# Patient Record
Sex: Female | Born: 1965 | Race: White | Hispanic: No | Smoking: Former smoker
Health system: Southern US, Community
[De-identification: ages and names within clinical notes are randomized; demographics above are authoritative.]

## PROBLEM LIST (undated history)

## (undated) DIAGNOSIS — K589 Irritable bowel syndrome without diarrhea: Secondary | ICD-10-CM

## (undated) DIAGNOSIS — T7840XA Allergy, unspecified, initial encounter: Secondary | ICD-10-CM

## (undated) DIAGNOSIS — F419 Anxiety disorder, unspecified: Secondary | ICD-10-CM

## (undated) HISTORY — DX: Irritable bowel syndrome, unspecified: K58.9

## (undated) HISTORY — DX: Allergy, unspecified, initial encounter: T78.40XA

## (undated) HISTORY — DX: Anxiety disorder, unspecified: F41.9

## (undated) HISTORY — PX: LAPAROSCOPY: SHX197

## (undated) HISTORY — PX: COLONOSCOPY: SHX174

---

## 1997-10-20 ENCOUNTER — Other Ambulatory Visit: Admission: RE | Admit: 1997-10-20 | Discharge: 1997-10-20 | Payer: Self-pay | Admitting: Obstetrics and Gynecology

## 1998-11-04 ENCOUNTER — Other Ambulatory Visit: Admission: RE | Admit: 1998-11-04 | Discharge: 1998-11-04 | Payer: Self-pay | Admitting: Obstetrics and Gynecology

## 1999-12-22 ENCOUNTER — Other Ambulatory Visit: Admission: RE | Admit: 1999-12-22 | Discharge: 1999-12-22 | Payer: Self-pay | Admitting: Obstetrics and Gynecology

## 2001-03-12 ENCOUNTER — Other Ambulatory Visit: Admission: RE | Admit: 2001-03-12 | Discharge: 2001-03-12 | Payer: Self-pay | Admitting: Obstetrics and Gynecology

## 2002-03-18 ENCOUNTER — Other Ambulatory Visit: Admission: RE | Admit: 2002-03-18 | Discharge: 2002-03-18 | Payer: Self-pay | Admitting: Obstetrics and Gynecology

## 2003-03-25 ENCOUNTER — Other Ambulatory Visit: Admission: RE | Admit: 2003-03-25 | Discharge: 2003-03-25 | Payer: Self-pay | Admitting: Obstetrics and Gynecology

## 2004-06-03 ENCOUNTER — Other Ambulatory Visit: Admission: RE | Admit: 2004-06-03 | Discharge: 2004-06-03 | Payer: Self-pay | Admitting: Obstetrics and Gynecology

## 2006-06-13 HISTORY — PX: SINOSCOPY: SHX187

## 2008-07-11 ENCOUNTER — Encounter: Admission: RE | Admit: 2008-07-11 | Discharge: 2008-07-11 | Payer: Self-pay | Admitting: Obstetrics and Gynecology

## 2009-07-13 ENCOUNTER — Encounter: Admission: RE | Admit: 2009-07-13 | Discharge: 2009-07-13 | Payer: Self-pay | Admitting: Obstetrics and Gynecology

## 2010-08-13 ENCOUNTER — Other Ambulatory Visit: Payer: Self-pay | Admitting: Obstetrics and Gynecology

## 2010-08-13 DIAGNOSIS — Z1231 Encounter for screening mammogram for malignant neoplasm of breast: Secondary | ICD-10-CM

## 2010-08-18 ENCOUNTER — Ambulatory Visit
Admission: RE | Admit: 2010-08-18 | Discharge: 2010-08-18 | Disposition: A | Discharge status: Home / Self Care | Payer: BC Managed Care – PPO | Source: Ambulatory Visit | Attending: Obstetrics and Gynecology | Admitting: Obstetrics and Gynecology

## 2010-08-18 DIAGNOSIS — Z1231 Encounter for screening mammogram for malignant neoplasm of breast: Secondary | ICD-10-CM

## 2011-08-12 ENCOUNTER — Other Ambulatory Visit: Payer: Self-pay | Admitting: Obstetrics and Gynecology

## 2011-08-12 DIAGNOSIS — Z1231 Encounter for screening mammogram for malignant neoplasm of breast: Secondary | ICD-10-CM

## 2011-08-19 ENCOUNTER — Ambulatory Visit
Admission: RE | Admit: 2011-08-19 | Discharge: 2011-08-19 | Disposition: A | Discharge status: Home / Self Care | Payer: BC Managed Care – PPO | Source: Ambulatory Visit | Attending: Obstetrics and Gynecology | Admitting: Obstetrics and Gynecology

## 2011-08-19 DIAGNOSIS — Z1231 Encounter for screening mammogram for malignant neoplasm of breast: Secondary | ICD-10-CM

## 2011-08-22 ENCOUNTER — Ambulatory Visit: Payer: BC Managed Care – PPO

## 2012-08-31 ENCOUNTER — Other Ambulatory Visit: Payer: Self-pay

## 2012-08-31 DIAGNOSIS — Z1231 Encounter for screening mammogram for malignant neoplasm of breast: Secondary | ICD-10-CM

## 2012-09-28 ENCOUNTER — Ambulatory Visit
Admission: RE | Admit: 2012-09-28 | Discharge: 2012-09-28 | Disposition: A | Discharge status: Home / Self Care | Payer: BC Managed Care – PPO | Source: Ambulatory Visit

## 2012-09-28 DIAGNOSIS — Z1231 Encounter for screening mammogram for malignant neoplasm of breast: Secondary | ICD-10-CM

## 2013-08-27 ENCOUNTER — Other Ambulatory Visit: Payer: Self-pay

## 2013-08-27 DIAGNOSIS — Z1231 Encounter for screening mammogram for malignant neoplasm of breast: Secondary | ICD-10-CM

## 2013-10-03 ENCOUNTER — Encounter (INDEPENDENT_AMBULATORY_CARE_PROVIDER_SITE_OTHER): Payer: Self-pay

## 2013-10-03 ENCOUNTER — Ambulatory Visit
Admission: RE | Admit: 2013-10-03 | Discharge: 2013-10-03 | Disposition: A | Discharge status: Home / Self Care | Payer: BC Managed Care – PPO | Source: Ambulatory Visit

## 2013-10-03 DIAGNOSIS — Z1231 Encounter for screening mammogram for malignant neoplasm of breast: Secondary | ICD-10-CM

## 2013-10-07 ENCOUNTER — Other Ambulatory Visit: Payer: Self-pay | Admitting: Obstetrics and Gynecology

## 2013-10-07 DIAGNOSIS — R928 Other abnormal and inconclusive findings on diagnostic imaging of breast: Secondary | ICD-10-CM

## 2013-10-17 ENCOUNTER — Ambulatory Visit
Admission: RE | Admit: 2013-10-17 | Discharge: 2013-10-17 | Disposition: A | Discharge status: Home / Self Care | Payer: BC Managed Care – PPO | Source: Ambulatory Visit | Attending: Obstetrics and Gynecology | Admitting: Obstetrics and Gynecology

## 2013-10-17 DIAGNOSIS — R928 Other abnormal and inconclusive findings on diagnostic imaging of breast: Secondary | ICD-10-CM

## 2015-02-17 ENCOUNTER — Other Ambulatory Visit: Payer: Self-pay | Admitting: Obstetrics and Gynecology

## 2015-02-17 DIAGNOSIS — R928 Other abnormal and inconclusive findings on diagnostic imaging of breast: Secondary | ICD-10-CM

## 2015-02-23 ENCOUNTER — Ambulatory Visit
Admission: RE | Admit: 2015-02-23 | Discharge: 2015-02-23 | Disposition: A | Discharge status: Home / Self Care | Payer: 59 | Source: Ambulatory Visit | Attending: Obstetrics and Gynecology | Admitting: Obstetrics and Gynecology

## 2015-02-23 DIAGNOSIS — R928 Other abnormal and inconclusive findings on diagnostic imaging of breast: Secondary | ICD-10-CM

## 2015-09-12 DIAGNOSIS — N951 Menopausal and female climacteric states: Secondary | ICD-10-CM | POA: Diagnosis not present

## 2015-09-12 DIAGNOSIS — E559 Vitamin D deficiency, unspecified: Secondary | ICD-10-CM | POA: Diagnosis not present

## 2015-09-12 DIAGNOSIS — E785 Hyperlipidemia, unspecified: Secondary | ICD-10-CM | POA: Diagnosis not present

## 2015-12-07 DIAGNOSIS — E785 Hyperlipidemia, unspecified: Secondary | ICD-10-CM | POA: Diagnosis not present

## 2015-12-07 DIAGNOSIS — N951 Menopausal and female climacteric states: Secondary | ICD-10-CM | POA: Diagnosis not present

## 2016-01-19 DIAGNOSIS — G501 Atypical facial pain: Secondary | ICD-10-CM | POA: Diagnosis not present

## 2016-01-19 DIAGNOSIS — R6884 Jaw pain: Secondary | ICD-10-CM | POA: Diagnosis not present

## 2016-02-25 DIAGNOSIS — R5383 Other fatigue: Secondary | ICD-10-CM | POA: Diagnosis not present

## 2016-02-25 DIAGNOSIS — N959 Unspecified menopausal and perimenopausal disorder: Secondary | ICD-10-CM | POA: Diagnosis not present

## 2016-02-25 DIAGNOSIS — K589 Irritable bowel syndrome without diarrhea: Secondary | ICD-10-CM | POA: Diagnosis not present

## 2016-02-25 DIAGNOSIS — F419 Anxiety disorder, unspecified: Secondary | ICD-10-CM | POA: Diagnosis not present

## 2016-03-08 DIAGNOSIS — Z6827 Body mass index (BMI) 27.0-27.9, adult: Secondary | ICD-10-CM | POA: Diagnosis not present

## 2016-03-08 DIAGNOSIS — Z1231 Encounter for screening mammogram for malignant neoplasm of breast: Secondary | ICD-10-CM | POA: Diagnosis not present

## 2016-03-08 DIAGNOSIS — Z01419 Encounter for gynecological examination (general) (routine) without abnormal findings: Secondary | ICD-10-CM | POA: Diagnosis not present

## 2016-03-12 DIAGNOSIS — N951 Menopausal and female climacteric states: Secondary | ICD-10-CM | POA: Diagnosis not present

## 2016-03-12 DIAGNOSIS — E785 Hyperlipidemia, unspecified: Secondary | ICD-10-CM | POA: Diagnosis not present

## 2016-04-04 DIAGNOSIS — R5383 Other fatigue: Secondary | ICD-10-CM | POA: Diagnosis not present

## 2016-04-04 DIAGNOSIS — K589 Irritable bowel syndrome without diarrhea: Secondary | ICD-10-CM | POA: Diagnosis not present

## 2016-04-04 DIAGNOSIS — E785 Hyperlipidemia, unspecified: Secondary | ICD-10-CM | POA: Diagnosis not present

## 2016-04-04 DIAGNOSIS — F419 Anxiety disorder, unspecified: Secondary | ICD-10-CM | POA: Diagnosis not present

## 2016-04-04 DIAGNOSIS — N959 Unspecified menopausal and perimenopausal disorder: Secondary | ICD-10-CM | POA: Diagnosis not present

## 2016-04-16 DIAGNOSIS — Z23 Encounter for immunization: Secondary | ICD-10-CM | POA: Diagnosis not present

## 2016-04-28 ENCOUNTER — Ambulatory Visit (INDEPENDENT_AMBULATORY_CARE_PROVIDER_SITE_OTHER): Payer: BLUE CROSS/BLUE SHIELD | Admitting: Allergy and Immunology

## 2016-04-28 ENCOUNTER — Encounter: Payer: Self-pay | Admitting: Allergy and Immunology

## 2016-04-28 VITALS — BP 110/68 | HR 68 | Temp 97.8°F | Resp 16 | Ht 67.0 in | Wt 180.0 lb

## 2016-04-28 DIAGNOSIS — R197 Diarrhea, unspecified: Secondary | ICD-10-CM

## 2016-04-28 DIAGNOSIS — Z91018 Allergy to other foods: Secondary | ICD-10-CM

## 2016-04-28 MED ORDER — COLESTIPOL HCL 1 G PO TABS: 1.0000 g | ORAL_TABLET | Freq: Two times a day (BID) | ORAL | 5 refills | Status: DC

## 2016-04-28 NOTE — Progress Notes (Signed)
NEW PATIENT NOTE  Referring Provider: No ref. provider found Primary Provider: Paulina FusiSCHULTZ,DOUGLAS E, MD Date of office visit: 04/28/2016    Subjective:   Chief Complaint:  Gabrielle Wilkerson (DOB: 08/30/1965) is a 50 y.o. female who presents to the clinic on 04/28/2016 with a chief complaint of Other (irritable bowel symptoms) .  HPI: Gabrielle Wilkerson presents to this clinic in evaluation of diarrhea. She has at least a 10 year history of diarrhea diagnosed as IBS. She does not really have a tremendous amount of constipation and she may have some occasional cramping but it is mostly just a diarrhea issue sometimes associated with tenesmus. She has this at least 3 times per week. She went on a recent diet modification where she is eating predominantly bird and vegetables and grains and this may have helped her somewhat but she still continues to have episodes at least 3 times a week. Very early in life she underwent a colonoscopy around the age of 50 for abdominal pain which apparently was nondiagnostic. She has sought out counsel with a integrative medicine practice and has had blood tests performed looking at IgG antibodies directed against food and has tried to modify her diet based upon these results which has not helped her. There is no obvious provoking factor giving rise to her diarrhea. She never notes any blood in her stool. She never notes any pus in her stool. She does drink 36 ounces of coffee every morning and has chocolate on almost a daily basis. She does not really have an atopic history other than some mild rhinitis and slight cough that may occur on some springtime seasons not requiring any therapy.  Past Medical History:  Diagnosis Date  . IBS (irritable bowel syndrome)     Past Surgical History:  Procedure Laterality Date  . LAPAROSCOPY  age 50  . SINOSCOPY  2008   Dr Shelva [facility]      Medication List      ALPRAZolam 0.25 MG tablet Commonly known as:  XANAX Take 0.25 mg by  mouth at bedtime as needed for anxiety.   aspirin EC 81 MG tablet Take 81 mg by mouth daily.   CALCIUM 500 + D3 PO Take 1,000 mg by mouth at bedtime.   Coenzyme Q10 100 MG Tabs Take 1 tablet by mouth daily.   Fish Oil 1000 MG Caps Take 200 mg by mouth daily.   MULTIVITAMIN ADULT PO Take by mouth.   PROBIOTIC-10 PO Take 1 tablet by mouth daily.   progesterone 100 MG capsule Commonly known as:  PROMETRIUM Take 100 mg by mouth daily.   Vitamin D-3 1000 units Caps Take 3,000 Units by mouth daily.       Allergies  Allergen Reactions  . Sulfa Antibiotics Rash    Review of systems negative except as noted in HPI / PMHx or noted below:  Review of Systems  Constitutional: Negative.   HENT: Negative.   Eyes: Negative.   Respiratory: Negative.   Cardiovascular: Negative.   Gastrointestinal: Negative.   Genitourinary: Negative.   Musculoskeletal: Negative.   Skin: Negative.   Neurological: Negative.   Endo/Heme/Allergies: Negative.   Psychiatric/Behavioral: Negative.     Family History  Problem Relation Age of Onset  . Alzheimer's disease Mother   . Heart Problems Father   . Hypertension Father   . Diabetes Father     Social History   Social History  . Marital status: Married    Spouse name: N/A  .  Number of children: N/A  . Years of education: N/A   Occupational History  . Not on file.   Social History Main Topics  . Smoking status: Former Smoker    Quit date: 06/14/2003  . Smokeless tobacco: Never Used  . Alcohol use Not on file  . Drug use: Unknown  . Sexual activity: Not on file   Other Topics Concern  . Not on file   Social History Narrative  . No narrative on file    Environmental and Social history  Lives in a house with a dry environment, cats and dogs located inside the household, carpeting in the bedroom, plastic on the bed and pillow, and no smoking ongoing with inside the household. She works in an office setting.  Objective:    Vitals:   04/28/16 0833  BP: 110/68  Pulse: 68  Resp: 16  Temp: 97.8 F (36.6 C)   Height: 5\' 7"  (170.2 cm) Weight: 180 lb (81.6 kg)  Physical Exam  Constitutional: She is well-developed, well-nourished, and in no distress.  HENT:  Head: Normocephalic. Head is without right periorbital erythema and without left periorbital erythema.  Right Ear: Tympanic membrane, external ear and ear canal normal.  Left Ear: Tympanic membrane, external ear and ear canal normal.  Nose: Nose normal. No mucosal edema or rhinorrhea.  Mouth/Throat: Oropharynx is clear and moist and mucous membranes are normal. No oropharyngeal exudate.  Eyes: Conjunctivae and lids are normal. Pupils are equal, round, and reactive to light.  Neck: Trachea normal. No tracheal deviation present. No thyromegaly present.  Cardiovascular: Normal rate, regular rhythm, S1 normal, S2 normal and normal heart sounds.   No murmur heard. Pulmonary/Chest: Effort normal. No stridor. No tachypnea. No respiratory distress. She has no wheezes. She has no rales. She exhibits no tenderness.  Abdominal: Soft. She exhibits no distension and no mass. There is no hepatosplenomegaly. There is no tenderness. There is no rebound and no guarding.  Musculoskeletal: She exhibits no edema or tenderness.  Lymphadenopathy:       Head (right side): No tonsillar adenopathy present.       Head (left side): No tonsillar adenopathy present.    She has no cervical adenopathy.    She has no axillary adenopathy.  Neurological: She is alert. Gait normal.  Skin: No rash noted. She is not diaphoretic. No erythema. No pallor. Nails show no clubbing.  Psychiatric: Mood and affect normal.    Diagnostics: Allergy skin tests were performed. She did not demonstrate any hypersensitivity against a screening panel of foods.  Assessment and Plan:    1. Food allergy   2. Diarrhea in adult patient     1. Allergen avoidance measures?  2. Test for bile salt  overabundance: Colestipol 1 g one tablet one-2 times a day  3. Blood - celiac screen with IgA  4. Further evaluation treatment? - Contact clinic next week with report  Lorene DyChristie will try colestipol to see if the issue with her diarrhea is tied up with bile salt-induced irritation of her intestine and I will obtain a blood test to look at possible celiac disease. I don't know if there is a very significant food allergy present giving rise to her issue but with his skin tests today there did not really appear to be a significant problem with hypersensitivity directed against specific foods. There could be some IgE mechanism giving rise to food hypersensitivity and this may need to be evaluated a little bit more thoroughly in the future  but for now we will going to hold off on any further evaluation for this issue and just see what happens by binding up her bile acids and of course ruling out celiac disease. There is also a possibility that she has developed lactose intolerance and we may need to work through that issue as well pending her response to therapy directed above.  Laurette Schimke, MD [facility]

## 2016-04-28 NOTE — Patient Instructions (Addendum)
  1. Allergen avoidance measures?  2. Test for bile salt overabundance: Colestipol 1 g one tablet one-2 times a day  3. Blood - celiac screen with IgA  4. Further evaluation treatment? - Contact clinic next week with report

## 2016-05-03 LAB — CELIAC PANEL 10
ANTIGLIADIN ABS, IGA: 4 U (ref 0–19)
ENDOMYSIAL IGA: NEGATIVE
Gliadin IgG: 1 units (ref 0–19)
IgA/Immunoglobulin A, Serum: 509 mg/dL — ABNORMAL HIGH (ref 87–352)

## 2016-05-03 LAB — RETICULIN ANTIBODIES, IGA W TITER: RETICULIN AB, IGA: NEGATIVE {titer}

## 2016-06-18 DIAGNOSIS — Z1389 Encounter for screening for other disorder: Secondary | ICD-10-CM | POA: Diagnosis not present

## 2016-06-18 DIAGNOSIS — Z0000 Encounter for general adult medical examination without abnormal findings: Secondary | ICD-10-CM | POA: Diagnosis not present

## 2016-06-18 DIAGNOSIS — E785 Hyperlipidemia, unspecified: Secondary | ICD-10-CM | POA: Diagnosis not present

## 2016-06-18 DIAGNOSIS — F419 Anxiety disorder, unspecified: Secondary | ICD-10-CM | POA: Diagnosis not present

## 2016-06-18 DIAGNOSIS — N959 Unspecified menopausal and perimenopausal disorder: Secondary | ICD-10-CM | POA: Diagnosis not present

## 2016-09-13 DIAGNOSIS — H40013 Open angle with borderline findings, low risk, bilateral: Secondary | ICD-10-CM | POA: Diagnosis not present

## 2016-09-17 DIAGNOSIS — E785 Hyperlipidemia, unspecified: Secondary | ICD-10-CM | POA: Diagnosis not present

## 2016-09-17 DIAGNOSIS — N951 Menopausal and female climacteric states: Secondary | ICD-10-CM | POA: Diagnosis not present

## 2016-11-22 DIAGNOSIS — H40013 Open angle with borderline findings, low risk, bilateral: Secondary | ICD-10-CM | POA: Diagnosis not present

## 2017-01-17 DIAGNOSIS — Z6829 Body mass index (BMI) 29.0-29.9, adult: Secondary | ICD-10-CM | POA: Diagnosis not present

## 2017-01-17 DIAGNOSIS — J019 Acute sinusitis, unspecified: Secondary | ICD-10-CM | POA: Diagnosis not present

## 2017-03-14 DIAGNOSIS — Z6829 Body mass index (BMI) 29.0-29.9, adult: Secondary | ICD-10-CM | POA: Diagnosis not present

## 2017-03-14 DIAGNOSIS — Z1231 Encounter for screening mammogram for malignant neoplasm of breast: Secondary | ICD-10-CM | POA: Diagnosis not present

## 2017-03-14 DIAGNOSIS — Z01419 Encounter for gynecological examination (general) (routine) without abnormal findings: Secondary | ICD-10-CM | POA: Diagnosis not present

## 2017-03-14 DIAGNOSIS — Z1151 Encounter for screening for human papillomavirus (HPV): Secondary | ICD-10-CM | POA: Diagnosis not present

## 2017-03-28 DIAGNOSIS — N882 Stricture and stenosis of cervix uteri: Secondary | ICD-10-CM | POA: Diagnosis not present

## 2017-03-28 DIAGNOSIS — N939 Abnormal uterine and vaginal bleeding, unspecified: Secondary | ICD-10-CM | POA: Diagnosis not present

## 2017-03-28 DIAGNOSIS — N950 Postmenopausal bleeding: Secondary | ICD-10-CM | POA: Diagnosis not present

## 2017-04-05 DIAGNOSIS — Z23 Encounter for immunization: Secondary | ICD-10-CM | POA: Diagnosis not present

## 2017-04-18 DIAGNOSIS — F411 Generalized anxiety disorder: Secondary | ICD-10-CM | POA: Diagnosis not present

## 2017-04-18 DIAGNOSIS — Z6829 Body mass index (BMI) 29.0-29.9, adult: Secondary | ICD-10-CM | POA: Diagnosis not present

## 2017-05-29 DIAGNOSIS — J343 Hypertrophy of nasal turbinates: Secondary | ICD-10-CM | POA: Diagnosis not present

## 2017-05-29 DIAGNOSIS — J342 Deviated nasal septum: Secondary | ICD-10-CM | POA: Diagnosis not present

## 2017-05-29 DIAGNOSIS — R0683 Snoring: Secondary | ICD-10-CM | POA: Diagnosis not present

## 2017-05-29 DIAGNOSIS — Z9889 Other specified postprocedural states: Secondary | ICD-10-CM | POA: Diagnosis not present

## 2017-07-22 DIAGNOSIS — Z0000 Encounter for general adult medical examination without abnormal findings: Secondary | ICD-10-CM | POA: Diagnosis not present

## 2017-07-28 DIAGNOSIS — Z1331 Encounter for screening for depression: Secondary | ICD-10-CM | POA: Diagnosis not present

## 2017-07-28 DIAGNOSIS — Z0000 Encounter for general adult medical examination without abnormal findings: Secondary | ICD-10-CM | POA: Diagnosis not present

## 2017-07-28 DIAGNOSIS — Z6828 Body mass index (BMI) 28.0-28.9, adult: Secondary | ICD-10-CM | POA: Diagnosis not present

## 2017-07-28 DIAGNOSIS — E785 Hyperlipidemia, unspecified: Secondary | ICD-10-CM | POA: Diagnosis not present

## 2017-10-30 DIAGNOSIS — Z6828 Body mass index (BMI) 28.0-28.9, adult: Secondary | ICD-10-CM | POA: Diagnosis not present

## 2017-10-30 DIAGNOSIS — J019 Acute sinusitis, unspecified: Secondary | ICD-10-CM | POA: Diagnosis not present

## 2018-03-12 DIAGNOSIS — M7042 Prepatellar bursitis, left knee: Secondary | ICD-10-CM | POA: Diagnosis not present

## 2018-03-23 DIAGNOSIS — Z6830 Body mass index (BMI) 30.0-30.9, adult: Secondary | ICD-10-CM | POA: Diagnosis not present

## 2018-03-23 DIAGNOSIS — Z1231 Encounter for screening mammogram for malignant neoplasm of breast: Secondary | ICD-10-CM | POA: Diagnosis not present

## 2018-03-23 DIAGNOSIS — Z01419 Encounter for gynecological examination (general) (routine) without abnormal findings: Secondary | ICD-10-CM | POA: Diagnosis not present

## 2018-04-12 DIAGNOSIS — J3089 Other allergic rhinitis: Secondary | ICD-10-CM | POA: Diagnosis not present

## 2018-04-13 DIAGNOSIS — Z23 Encounter for immunization: Secondary | ICD-10-CM | POA: Diagnosis not present

## 2018-06-29 DIAGNOSIS — F411 Generalized anxiety disorder: Secondary | ICD-10-CM | POA: Diagnosis not present

## 2018-06-29 DIAGNOSIS — Z1211 Encounter for screening for malignant neoplasm of colon: Secondary | ICD-10-CM | POA: Diagnosis not present

## 2018-07-16 DIAGNOSIS — R3129 Other microscopic hematuria: Secondary | ICD-10-CM | POA: Diagnosis not present

## 2018-07-16 DIAGNOSIS — F410 Panic disorder [episodic paroxysmal anxiety] without agoraphobia: Secondary | ICD-10-CM | POA: Diagnosis not present

## 2018-07-17 DIAGNOSIS — R05 Cough: Secondary | ICD-10-CM | POA: Diagnosis not present

## 2018-07-17 DIAGNOSIS — Z79899 Other long term (current) drug therapy: Secondary | ICD-10-CM | POA: Diagnosis not present

## 2018-07-17 DIAGNOSIS — R3129 Other microscopic hematuria: Secondary | ICD-10-CM | POA: Diagnosis not present

## 2018-07-17 DIAGNOSIS — Z87442 Personal history of urinary calculi: Secondary | ICD-10-CM | POA: Diagnosis not present

## 2018-07-17 DIAGNOSIS — J029 Acute pharyngitis, unspecified: Secondary | ICD-10-CM | POA: Diagnosis not present

## 2018-07-17 DIAGNOSIS — R509 Fever, unspecified: Secondary | ICD-10-CM | POA: Diagnosis not present

## 2018-07-23 DIAGNOSIS — F410 Panic disorder [episodic paroxysmal anxiety] without agoraphobia: Secondary | ICD-10-CM | POA: Diagnosis not present

## 2018-07-28 DIAGNOSIS — E785 Hyperlipidemia, unspecified: Secondary | ICD-10-CM | POA: Diagnosis not present

## 2018-07-28 DIAGNOSIS — R3129 Other microscopic hematuria: Secondary | ICD-10-CM | POA: Diagnosis not present

## 2018-07-28 DIAGNOSIS — F411 Generalized anxiety disorder: Secondary | ICD-10-CM | POA: Diagnosis not present

## 2018-07-28 DIAGNOSIS — Z0000 Encounter for general adult medical examination without abnormal findings: Secondary | ICD-10-CM | POA: Diagnosis not present

## 2018-07-30 DIAGNOSIS — R3129 Other microscopic hematuria: Secondary | ICD-10-CM | POA: Diagnosis not present

## 2018-07-30 DIAGNOSIS — I700 Atherosclerosis of aorta: Secondary | ICD-10-CM | POA: Diagnosis not present

## 2018-07-30 DIAGNOSIS — Z87442 Personal history of urinary calculi: Secondary | ICD-10-CM | POA: Diagnosis not present

## 2018-07-30 DIAGNOSIS — N281 Cyst of kidney, acquired: Secondary | ICD-10-CM | POA: Diagnosis not present

## 2018-07-31 ENCOUNTER — Encounter: Payer: Self-pay | Admitting: Gastroenterology

## 2018-07-31 DIAGNOSIS — F410 Panic disorder [episodic paroxysmal anxiety] without agoraphobia: Secondary | ICD-10-CM | POA: Diagnosis not present

## 2018-08-07 DIAGNOSIS — F410 Panic disorder [episodic paroxysmal anxiety] without agoraphobia: Secondary | ICD-10-CM | POA: Diagnosis not present

## 2018-08-13 DIAGNOSIS — F410 Panic disorder [episodic paroxysmal anxiety] without agoraphobia: Secondary | ICD-10-CM | POA: Diagnosis not present

## 2018-08-14 DIAGNOSIS — N281 Cyst of kidney, acquired: Secondary | ICD-10-CM | POA: Diagnosis not present

## 2018-08-14 DIAGNOSIS — R3129 Other microscopic hematuria: Secondary | ICD-10-CM | POA: Diagnosis not present

## 2018-08-24 ENCOUNTER — Ambulatory Visit (AMBULATORY_SURGERY_CENTER): Payer: Self-pay

## 2018-08-24 ENCOUNTER — Other Ambulatory Visit: Payer: Self-pay

## 2018-08-24 VITALS — Temp 97.9°F | Ht 67.0 in | Wt 186.6 lb

## 2018-08-24 DIAGNOSIS — Z1211 Encounter for screening for malignant neoplasm of colon: Secondary | ICD-10-CM

## 2018-08-24 DIAGNOSIS — Z8371 Family history of colonic polyps: Secondary | ICD-10-CM

## 2018-08-24 DIAGNOSIS — F410 Panic disorder [episodic paroxysmal anxiety] without agoraphobia: Secondary | ICD-10-CM | POA: Diagnosis not present

## 2018-08-24 NOTE — Progress Notes (Signed)
Per pt, no allergies to soy or egg products.Pt not taking any weight loss meds or using  O2 at home.  Pt refused emmi video.  Temp- 97.9. Per pt, has not traveled recently out of the country. No signs or symptoms of an illness.

## 2018-08-31 DIAGNOSIS — R0981 Nasal congestion: Secondary | ICD-10-CM | POA: Diagnosis not present

## 2018-08-31 DIAGNOSIS — J028 Acute pharyngitis due to other specified organisms: Secondary | ICD-10-CM | POA: Diagnosis not present

## 2018-09-04 DIAGNOSIS — F410 Panic disorder [episodic paroxysmal anxiety] without agoraphobia: Secondary | ICD-10-CM | POA: Diagnosis not present

## 2018-09-10 ENCOUNTER — Encounter: Payer: 59 | Admitting: Gastroenterology

## 2018-09-11 DIAGNOSIS — D2239 Melanocytic nevi of other parts of face: Secondary | ICD-10-CM | POA: Diagnosis not present

## 2018-09-11 DIAGNOSIS — L309 Dermatitis, unspecified: Secondary | ICD-10-CM | POA: Diagnosis not present

## 2018-09-11 DIAGNOSIS — L918 Other hypertrophic disorders of the skin: Secondary | ICD-10-CM | POA: Diagnosis not present

## 2018-09-11 DIAGNOSIS — D225 Melanocytic nevi of trunk: Secondary | ICD-10-CM | POA: Diagnosis not present

## 2018-09-20 DIAGNOSIS — F410 Panic disorder [episodic paroxysmal anxiety] without agoraphobia: Secondary | ICD-10-CM | POA: Diagnosis not present

## 2018-10-15 DIAGNOSIS — F410 Panic disorder [episodic paroxysmal anxiety] without agoraphobia: Secondary | ICD-10-CM | POA: Diagnosis not present

## 2018-10-23 DIAGNOSIS — F410 Panic disorder [episodic paroxysmal anxiety] without agoraphobia: Secondary | ICD-10-CM | POA: Diagnosis not present

## 2018-10-30 DIAGNOSIS — F410 Panic disorder [episodic paroxysmal anxiety] without agoraphobia: Secondary | ICD-10-CM | POA: Diagnosis not present

## 2018-11-01 ENCOUNTER — Encounter: Payer: Self-pay | Admitting: Gastroenterology

## 2018-11-08 DIAGNOSIS — M9902 Segmental and somatic dysfunction of thoracic region: Secondary | ICD-10-CM | POA: Diagnosis not present

## 2018-11-08 DIAGNOSIS — M62830 Muscle spasm of back: Secondary | ICD-10-CM | POA: Diagnosis not present

## 2018-11-08 DIAGNOSIS — M546 Pain in thoracic spine: Secondary | ICD-10-CM | POA: Diagnosis not present

## 2018-11-13 ENCOUNTER — Ambulatory Visit (AMBULATORY_SURGERY_CENTER): Payer: Self-pay | Admitting: *Deleted

## 2018-11-13 ENCOUNTER — Encounter: Payer: Self-pay | Admitting: Gastroenterology

## 2018-11-13 ENCOUNTER — Other Ambulatory Visit: Payer: Self-pay

## 2018-11-13 VITALS — Ht 67.5 in | Wt 185.0 lb

## 2018-11-13 DIAGNOSIS — Z1211 Encounter for screening for malignant neoplasm of colon: Secondary | ICD-10-CM

## 2018-11-13 MED ORDER — NA SULFATE-K SULFATE-MG SULF 17.5-3.13-1.6 GM/177ML PO SOLN: 1.0000 | Freq: Once | ORAL | 0 refills | Status: AC

## 2018-11-13 NOTE — Progress Notes (Signed)
No egg or soy allergy known to patient  No issues with past sedation with any surgeries  or procedures, no intubation problems  No diet pills per patient No home 02 use per patient  No blood thinners per patient  Pt denies issues with constipation  No A fib or A flutter  EMMI video sent to pt's e mail    Pt verified name, DOB, address and insurance during PV today. Pt mailed instruction packet to included paper to complete and mail back to [facility] with addressed and stamped envelope, Emmi video, copy of consent form to read and not return, and instructions. Suprep $15  coupon mailed in packet. PV completed over the phone. Pt encouraged to call with questions or issues   

## 2018-11-15 DIAGNOSIS — M9902 Segmental and somatic dysfunction of thoracic region: Secondary | ICD-10-CM | POA: Diagnosis not present

## 2018-11-15 DIAGNOSIS — M546 Pain in thoracic spine: Secondary | ICD-10-CM | POA: Diagnosis not present

## 2018-11-15 DIAGNOSIS — M62830 Muscle spasm of back: Secondary | ICD-10-CM | POA: Diagnosis not present

## 2018-11-22 DIAGNOSIS — M546 Pain in thoracic spine: Secondary | ICD-10-CM | POA: Diagnosis not present

## 2018-11-22 DIAGNOSIS — M9902 Segmental and somatic dysfunction of thoracic region: Secondary | ICD-10-CM | POA: Diagnosis not present

## 2018-11-22 DIAGNOSIS — M62830 Muscle spasm of back: Secondary | ICD-10-CM | POA: Diagnosis not present

## 2018-11-23 ENCOUNTER — Telehealth: Payer: Self-pay

## 2018-11-23 NOTE — Telephone Encounter (Signed)
Covid-19 screening questions  Have you traveled in the last 14 days? No. If yes where?  Do you now or have you had a fever in the last 14 days? No.  Do you have any respiratory symptoms of shortness of breath or cough now or in the last 14 days? No.  Do you have any family members or close contacts with diagnosed or suspected Covid-19 in the past 14 days? No.  Have you been tested for Covid-19 and found to be positive? No.       

## 2018-11-26 ENCOUNTER — Encounter: Payer: Self-pay | Admitting: Gastroenterology

## 2018-11-26 ENCOUNTER — Ambulatory Visit (AMBULATORY_SURGERY_CENTER): Payer: BLUE CROSS/BLUE SHIELD | Admitting: Gastroenterology

## 2018-11-26 ENCOUNTER — Other Ambulatory Visit: Payer: Self-pay

## 2018-11-26 VITALS — BP 122/74 | HR 68 | Temp 98.6°F | Resp 26 | Ht 67.0 in | Wt 180.0 lb

## 2018-11-26 DIAGNOSIS — Z8371 Family history of colonic polyps: Secondary | ICD-10-CM | POA: Diagnosis not present

## 2018-11-26 DIAGNOSIS — Z1211 Encounter for screening for malignant neoplasm of colon: Secondary | ICD-10-CM | POA: Diagnosis not present

## 2018-11-26 MED ORDER — SODIUM CHLORIDE 0.9 % IV SOLN: 500.0000 mL | Freq: Once | INTRAVENOUS | Status: DC

## 2018-11-26 NOTE — Patient Instructions (Signed)
Information on hemorrhoids given to you today.  YOU HAD AN ENDOSCOPIC PROCEDURE TODAY AT THE [facility]:   Refer to the procedure report that was given to you for any specific questions about what was found during the examination.  If the procedure report does not answer your questions, please call your gastroenterologist to clarify.  If you requested that your care partner not be given the details of your procedure findings, then the procedure report has been included in a sealed envelope for you to review at your convenience later.  YOU SHOULD EXPECT: Some feelings of bloating in the abdomen. Passage of more gas than usual.  Walking can help get rid of the air that was put into your GI tract during the procedure and reduce the bloating. If you had a lower endoscopy (such as a colonoscopy or flexible sigmoidoscopy) you may notice spotting of blood in your stool or on the toilet paper. If you underwent a bowel prep for your procedure, you may not have a normal bowel movement for a few days.  Please Note:  You might notice some irritation and congestion in your nose or some drainage.  This is from the oxygen used during your procedure.  There is no need for concern and it should clear up in a day or so.  SYMPTOMS TO REPORT IMMEDIATELY:   Following lower endoscopy (colonoscopy or flexible sigmoidoscopy):  Excessive amounts of blood in the stool  Significant tenderness or worsening of abdominal pains  Swelling of the abdomen that is new, acute  Fever of 100F or higher   For urgent or emergent issues, a gastroenterologist can be reached at any hour by calling [phone].   DIET:  We do recommend a small meal at first, but then you may proceed to your regular diet.  Drink plenty of fluids but you should avoid alcoholic beverages for 24 hours.  ACTIVITY:  You should plan to take it easy for the rest of today and you should NOT DRIVE or use heavy machinery until tomorrow (because  of the sedation medicines used during the test).    FOLLOW UP: Our staff will call the number listed on your records 48-72 hours following your procedure to check on you and address any questions or concerns that you may have regarding the information given to you following your procedure. If we do not reach you, we will leave a message.  We will attempt to reach you two times.  During this call, we will ask if you have developed any symptoms of COVID 19. If you develop any symptoms (ie: fever, flu-like symptoms, shortness of breath, cough etc.) before then, please call [phone].  If you test positive for Covid 19 in the 2 weeks post procedure, please call and report this information to us.    If any biopsies were taken you will be contacted by phone or by letter within the next 1-3 weeks.  Please call us at [phone] if you have not heard about the biopsies in 3 weeks.    SIGNATURES/CONFIDENTIALITY: You and/or your care partner have signed paperwork which will be entered into your electronic medical record.  These signatures attest to the fact that that the information above on your After Visit Summary has been reviewed and is understood.  Full responsibility of the confidentiality of this discharge information lies with you and/or your care-partner. 

## 2018-11-26 NOTE — Op Note (Signed)
[facility] Patient Name: Gabrielle Wilkerson Procedure Date: 11/26/2018 9:35 AM MRN: [id number] Endoscopist: Lynann Bolognaajesh Kamari Bilek , MD Age: [age] Referring MD:  Date of Birth: 03/13/1966 Gender: Female Account #: [id number] Procedure:                Colonoscopy Indications:              Colon cancer screening in patient at increased                            risk: Family history of 1st-degree relative (mom)                            with colon polyps before age 51 years Medicines:                Monitored Anesthesia Care Procedure:                Pre-Anesthesia Assessment:                           - Prior to the procedure, a History and Physical                            was performed, and patient medications and                            allergies were reviewed. The patient's tolerance of                            previous anesthesia was also reviewed. The risks                            and benefits of the procedure and the sedation                            options and risks were discussed with the patient.                            All questions were answered, and informed consent                            was obtained. Prior Anticoagulants: The patient has                            taken no previous anticoagulant or antiplatelet                            agents. ASA Grade Assessment: I - A normal, healthy                            patient. After reviewing the risks and benefits,                            the patient was deemed in satisfactory condition to  undergo the procedure.                           After obtaining informed consent, the colonoscope                            was passed under direct vision. Throughout the                            procedure, the patient's blood pressure, pulse, and                            oxygen saturations were monitored continuously. The                            Colonoscope was introduced through  the anus and                            advanced to the 2 cm into the ileum. The                            colonoscopy was performed without difficulty. The                            patient tolerated the procedure well. The quality                            of the bowel preparation was good. The terminal                            ileum, ileocecal valve, appendiceal orifice, and                            rectum were photographed. Scope In: 9:49:31 AM Scope Out: 10:04:42 AM Scope Withdrawal Time: 0 hours 11 minutes 37 seconds  Total Procedure Duration: 0 hours 15 minutes 11 seconds  Findings:                 Non-bleeding internal hemorrhoids were found during                            retroflexion. The hemorrhoids were small.                           The terminal ileum appeared normal.                           The exam was otherwise without abnormality on                            direct and retroflexion views. Complications:            No immediate complications. Estimated Blood Loss:     Estimated blood loss: none. Impression:               -Minimal internal hemorrhoids.                           -  Otherwise normal colonoscopy to TI. Recommendation:           - Patient has a contact number available for                            emergencies. The signs and symptoms of potential                            delayed complications were discussed with the                            patient. Return to normal activities tomorrow.                            Written discharge instructions were provided to the                            patient.                           - Resume previous diet.                           - Continue present medications.                           - Repeat colonoscopy in 5 years for screening                            purposes d/t strong family history of colonic                            polyps.                           - Return to GI clinic PRN. Lynann Bolognaajesh  Daziya Redmond, MD 11/26/2018 10:09:28 AM This report has been signed electronically.

## 2018-11-26 NOTE — Progress Notes (Signed)
Pt's states no medical or surgical changes since previsit or office visit.  Temps CW Vitals JB 

## 2018-11-26 NOTE — Progress Notes (Signed)
PT taken to PACU. Monitors in place. VSS. Report given to RN. 

## 2018-11-27 DIAGNOSIS — F410 Panic disorder [episodic paroxysmal anxiety] without agoraphobia: Secondary | ICD-10-CM | POA: Diagnosis not present

## 2018-11-28 ENCOUNTER — Telehealth: Payer: Self-pay

## 2018-11-28 NOTE — Telephone Encounter (Signed)
  Follow up Call-  Call back number 11/26/2018  Post procedure Call Back phone  # [phone]  Permission to leave phone message Yes  Some recent data might be hidden     Patient questions:  Do you have a fever, pain , or abdominal swelling? No. Pain Score  0 *  Have you tolerated food without any problems? Yes.    Have you been able to return to your normal activities? Yes.    Do you have any questions about your discharge instructions: Diet   No. Medications  No. Follow up visit  No.  Do you have questions or concerns about your Care? No.  Actions: * If pain score is 4 or above: No action needed, pain <4.  1. Have you developed a fever since your procedure? no  2.   Have you had an respiratory symptoms (SOB or cough) since your procedure? no  3.   Have you tested positive for COVID 19 since your procedure no  4.   Have you had any family members/close contacts diagnosed with the COVID 19 since your procedure?  no   If yes to any of these questions please route to Joylene John, RN and Alphonsa Gin, Therapist, sports.

## 2018-11-29 DIAGNOSIS — M546 Pain in thoracic spine: Secondary | ICD-10-CM | POA: Diagnosis not present

## 2018-11-29 DIAGNOSIS — M9902 Segmental and somatic dysfunction of thoracic region: Secondary | ICD-10-CM | POA: Diagnosis not present

## 2018-11-29 DIAGNOSIS — M62830 Muscle spasm of back: Secondary | ICD-10-CM | POA: Diagnosis not present

## 2018-12-13 DIAGNOSIS — M9902 Segmental and somatic dysfunction of thoracic region: Secondary | ICD-10-CM | POA: Diagnosis not present

## 2018-12-13 DIAGNOSIS — M62830 Muscle spasm of back: Secondary | ICD-10-CM | POA: Diagnosis not present

## 2018-12-13 DIAGNOSIS — M546 Pain in thoracic spine: Secondary | ICD-10-CM | POA: Diagnosis not present

## 2018-12-27 DIAGNOSIS — M9902 Segmental and somatic dysfunction of thoracic region: Secondary | ICD-10-CM | POA: Diagnosis not present

## 2018-12-27 DIAGNOSIS — M546 Pain in thoracic spine: Secondary | ICD-10-CM | POA: Diagnosis not present

## 2018-12-27 DIAGNOSIS — M62830 Muscle spasm of back: Secondary | ICD-10-CM | POA: Diagnosis not present

## 2019-01-10 DIAGNOSIS — M62830 Muscle spasm of back: Secondary | ICD-10-CM | POA: Diagnosis not present

## 2019-01-10 DIAGNOSIS — M9902 Segmental and somatic dysfunction of thoracic region: Secondary | ICD-10-CM | POA: Diagnosis not present

## 2019-01-10 DIAGNOSIS — M546 Pain in thoracic spine: Secondary | ICD-10-CM | POA: Diagnosis not present

## 2019-01-22 DIAGNOSIS — M546 Pain in thoracic spine: Secondary | ICD-10-CM | POA: Diagnosis not present

## 2019-01-22 DIAGNOSIS — M62830 Muscle spasm of back: Secondary | ICD-10-CM | POA: Diagnosis not present

## 2019-01-22 DIAGNOSIS — M9902 Segmental and somatic dysfunction of thoracic region: Secondary | ICD-10-CM | POA: Diagnosis not present

## 2019-02-19 DIAGNOSIS — M62830 Muscle spasm of back: Secondary | ICD-10-CM | POA: Diagnosis not present

## 2019-02-19 DIAGNOSIS — M9902 Segmental and somatic dysfunction of thoracic region: Secondary | ICD-10-CM | POA: Diagnosis not present

## 2019-02-19 DIAGNOSIS — M546 Pain in thoracic spine: Secondary | ICD-10-CM | POA: Diagnosis not present

## 2019-02-25 DIAGNOSIS — N281 Cyst of kidney, acquired: Secondary | ICD-10-CM | POA: Diagnosis not present

## 2019-02-25 DIAGNOSIS — R3129 Other microscopic hematuria: Secondary | ICD-10-CM | POA: Diagnosis not present

## 2019-03-19 DIAGNOSIS — M62830 Muscle spasm of back: Secondary | ICD-10-CM | POA: Diagnosis not present

## 2019-03-19 DIAGNOSIS — M546 Pain in thoracic spine: Secondary | ICD-10-CM | POA: Diagnosis not present

## 2019-03-19 DIAGNOSIS — M9902 Segmental and somatic dysfunction of thoracic region: Secondary | ICD-10-CM | POA: Diagnosis not present

## 2019-03-23 DIAGNOSIS — Z23 Encounter for immunization: Secondary | ICD-10-CM | POA: Diagnosis not present

## 2019-05-16 DIAGNOSIS — M62830 Muscle spasm of back: Secondary | ICD-10-CM | POA: Diagnosis not present

## 2019-05-16 DIAGNOSIS — M546 Pain in thoracic spine: Secondary | ICD-10-CM | POA: Diagnosis not present

## 2019-05-16 DIAGNOSIS — M9902 Segmental and somatic dysfunction of thoracic region: Secondary | ICD-10-CM | POA: Diagnosis not present

## 2019-05-20 DIAGNOSIS — R509 Fever, unspecified: Secondary | ICD-10-CM | POA: Diagnosis not present

## 2019-05-20 DIAGNOSIS — R519 Headache, unspecified: Secondary | ICD-10-CM | POA: Diagnosis not present

## 2019-05-20 DIAGNOSIS — Z20828 Contact with and (suspected) exposure to other viral communicable diseases: Secondary | ICD-10-CM | POA: Diagnosis not present

## 2019-05-20 DIAGNOSIS — J0110 Acute frontal sinusitis, unspecified: Secondary | ICD-10-CM | POA: Diagnosis not present

## 2019-05-20 DIAGNOSIS — M791 Myalgia, unspecified site: Secondary | ICD-10-CM | POA: Diagnosis not present

## 2019-06-10 DIAGNOSIS — Z6829 Body mass index (BMI) 29.0-29.9, adult: Secondary | ICD-10-CM | POA: Diagnosis not present

## 2019-06-10 DIAGNOSIS — Z1231 Encounter for screening mammogram for malignant neoplasm of breast: Secondary | ICD-10-CM | POA: Diagnosis not present

## 2019-06-10 DIAGNOSIS — Z01419 Encounter for gynecological examination (general) (routine) without abnormal findings: Secondary | ICD-10-CM | POA: Diagnosis not present

## 2019-08-03 DIAGNOSIS — E785 Hyperlipidemia, unspecified: Secondary | ICD-10-CM | POA: Diagnosis not present

## 2019-08-03 DIAGNOSIS — Z1331 Encounter for screening for depression: Secondary | ICD-10-CM | POA: Diagnosis not present

## 2019-08-03 DIAGNOSIS — F411 Generalized anxiety disorder: Secondary | ICD-10-CM | POA: Diagnosis not present

## 2019-08-03 DIAGNOSIS — Z0000 Encounter for general adult medical examination without abnormal findings: Secondary | ICD-10-CM | POA: Diagnosis not present

## 2019-08-12 DIAGNOSIS — L578 Other skin changes due to chronic exposure to nonionizing radiation: Secondary | ICD-10-CM | POA: Diagnosis not present

## 2019-08-12 DIAGNOSIS — D225 Melanocytic nevi of trunk: Secondary | ICD-10-CM | POA: Diagnosis not present

## 2019-08-12 DIAGNOSIS — L814 Other melanin hyperpigmentation: Secondary | ICD-10-CM | POA: Diagnosis not present

## 2019-08-12 DIAGNOSIS — L821 Other seborrheic keratosis: Secondary | ICD-10-CM | POA: Diagnosis not present

## 2019-08-15 DIAGNOSIS — M9905 Segmental and somatic dysfunction of pelvic region: Secondary | ICD-10-CM | POA: Diagnosis not present

## 2019-08-15 DIAGNOSIS — M9903 Segmental and somatic dysfunction of lumbar region: Secondary | ICD-10-CM | POA: Diagnosis not present

## 2019-08-15 DIAGNOSIS — M545 Low back pain: Secondary | ICD-10-CM | POA: Diagnosis not present

## 2019-08-15 DIAGNOSIS — M9902 Segmental and somatic dysfunction of thoracic region: Secondary | ICD-10-CM | POA: Diagnosis not present

## 2019-08-16 DIAGNOSIS — N281 Cyst of kidney, acquired: Secondary | ICD-10-CM | POA: Diagnosis not present

## 2019-08-16 DIAGNOSIS — R3129 Other microscopic hematuria: Secondary | ICD-10-CM | POA: Diagnosis not present

## 2019-08-16 DIAGNOSIS — N3289 Other specified disorders of bladder: Secondary | ICD-10-CM | POA: Diagnosis not present

## 2019-09-17 DIAGNOSIS — M545 Low back pain: Secondary | ICD-10-CM | POA: Diagnosis not present

## 2019-09-17 DIAGNOSIS — M9903 Segmental and somatic dysfunction of lumbar region: Secondary | ICD-10-CM | POA: Diagnosis not present

## 2019-09-17 DIAGNOSIS — M9902 Segmental and somatic dysfunction of thoracic region: Secondary | ICD-10-CM | POA: Diagnosis not present

## 2019-09-17 DIAGNOSIS — M9905 Segmental and somatic dysfunction of pelvic region: Secondary | ICD-10-CM | POA: Diagnosis not present

## 2019-10-24 DIAGNOSIS — M9905 Segmental and somatic dysfunction of pelvic region: Secondary | ICD-10-CM | POA: Diagnosis not present

## 2019-10-24 DIAGNOSIS — M545 Low back pain: Secondary | ICD-10-CM | POA: Diagnosis not present

## 2019-10-24 DIAGNOSIS — M9903 Segmental and somatic dysfunction of lumbar region: Secondary | ICD-10-CM | POA: Diagnosis not present

## 2019-10-24 DIAGNOSIS — M9902 Segmental and somatic dysfunction of thoracic region: Secondary | ICD-10-CM | POA: Diagnosis not present

## 2019-11-21 DIAGNOSIS — M9905 Segmental and somatic dysfunction of pelvic region: Secondary | ICD-10-CM | POA: Diagnosis not present

## 2019-11-21 DIAGNOSIS — M9903 Segmental and somatic dysfunction of lumbar region: Secondary | ICD-10-CM | POA: Diagnosis not present

## 2019-11-21 DIAGNOSIS — M9902 Segmental and somatic dysfunction of thoracic region: Secondary | ICD-10-CM | POA: Diagnosis not present

## 2019-11-21 DIAGNOSIS — M545 Low back pain: Secondary | ICD-10-CM | POA: Diagnosis not present

## 2019-12-19 DIAGNOSIS — M9903 Segmental and somatic dysfunction of lumbar region: Secondary | ICD-10-CM | POA: Diagnosis not present

## 2019-12-19 DIAGNOSIS — M545 Low back pain: Secondary | ICD-10-CM | POA: Diagnosis not present

## 2019-12-19 DIAGNOSIS — M9905 Segmental and somatic dysfunction of pelvic region: Secondary | ICD-10-CM | POA: Diagnosis not present

## 2019-12-19 DIAGNOSIS — M9902 Segmental and somatic dysfunction of thoracic region: Secondary | ICD-10-CM | POA: Diagnosis not present

## 2020-01-14 DIAGNOSIS — M7061 Trochanteric bursitis, right hip: Secondary | ICD-10-CM | POA: Diagnosis not present

## 2020-01-14 DIAGNOSIS — M25551 Pain in right hip: Secondary | ICD-10-CM | POA: Diagnosis not present

## 2020-01-14 HISTORY — DX: Trochanteric bursitis, right hip: M70.61

## 2020-02-25 DIAGNOSIS — M9902 Segmental and somatic dysfunction of thoracic region: Secondary | ICD-10-CM | POA: Diagnosis not present

## 2020-02-25 DIAGNOSIS — M9903 Segmental and somatic dysfunction of lumbar region: Secondary | ICD-10-CM | POA: Diagnosis not present

## 2020-02-25 DIAGNOSIS — M545 Low back pain: Secondary | ICD-10-CM | POA: Diagnosis not present

## 2020-02-25 DIAGNOSIS — M9905 Segmental and somatic dysfunction of pelvic region: Secondary | ICD-10-CM | POA: Diagnosis not present

## 2020-03-20 DIAGNOSIS — F411 Generalized anxiety disorder: Secondary | ICD-10-CM | POA: Diagnosis not present

## 2020-03-20 DIAGNOSIS — Z6829 Body mass index (BMI) 29.0-29.9, adult: Secondary | ICD-10-CM | POA: Diagnosis not present

## 2020-03-20 DIAGNOSIS — R030 Elevated blood-pressure reading, without diagnosis of hypertension: Secondary | ICD-10-CM | POA: Diagnosis not present

## 2020-04-03 DIAGNOSIS — R030 Elevated blood-pressure reading, without diagnosis of hypertension: Secondary | ICD-10-CM | POA: Diagnosis not present

## 2020-04-03 DIAGNOSIS — F411 Generalized anxiety disorder: Secondary | ICD-10-CM | POA: Diagnosis not present

## 2020-04-03 DIAGNOSIS — Z6828 Body mass index (BMI) 28.0-28.9, adult: Secondary | ICD-10-CM | POA: Diagnosis not present

## 2020-05-01 DIAGNOSIS — Z23 Encounter for immunization: Secondary | ICD-10-CM | POA: Diagnosis not present

## 2020-07-13 DIAGNOSIS — Z124 Encounter for screening for malignant neoplasm of cervix: Secondary | ICD-10-CM | POA: Diagnosis not present

## 2020-07-13 DIAGNOSIS — Z01411 Encounter for gynecological examination (general) (routine) with abnormal findings: Secondary | ICD-10-CM | POA: Diagnosis not present

## 2020-07-13 DIAGNOSIS — Z1231 Encounter for screening mammogram for malignant neoplasm of breast: Secondary | ICD-10-CM | POA: Diagnosis not present

## 2020-07-13 DIAGNOSIS — Z6829 Body mass index (BMI) 29.0-29.9, adult: Secondary | ICD-10-CM | POA: Diagnosis not present

## 2020-07-13 DIAGNOSIS — Z01419 Encounter for gynecological examination (general) (routine) without abnormal findings: Secondary | ICD-10-CM | POA: Diagnosis not present

## 2020-07-15 ENCOUNTER — Other Ambulatory Visit: Payer: Self-pay | Admitting: Obstetrics and Gynecology

## 2020-07-15 DIAGNOSIS — R3129 Other microscopic hematuria: Secondary | ICD-10-CM | POA: Diagnosis not present

## 2020-07-15 DIAGNOSIS — N951 Menopausal and female climacteric states: Secondary | ICD-10-CM

## 2020-07-15 DIAGNOSIS — N3289 Other specified disorders of bladder: Secondary | ICD-10-CM | POA: Diagnosis not present

## 2020-07-15 DIAGNOSIS — N281 Cyst of kidney, acquired: Secondary | ICD-10-CM | POA: Diagnosis not present

## 2020-07-30 DIAGNOSIS — Z6829 Body mass index (BMI) 29.0-29.9, adult: Secondary | ICD-10-CM | POA: Diagnosis not present

## 2020-07-30 DIAGNOSIS — Z0000 Encounter for general adult medical examination without abnormal findings: Secondary | ICD-10-CM | POA: Diagnosis not present

## 2020-08-17 DIAGNOSIS — L821 Other seborrheic keratosis: Secondary | ICD-10-CM | POA: Diagnosis not present

## 2020-08-17 DIAGNOSIS — D225 Melanocytic nevi of trunk: Secondary | ICD-10-CM | POA: Diagnosis not present

## 2020-08-17 DIAGNOSIS — L578 Other skin changes due to chronic exposure to nonionizing radiation: Secondary | ICD-10-CM | POA: Diagnosis not present

## 2020-08-17 DIAGNOSIS — L814 Other melanin hyperpigmentation: Secondary | ICD-10-CM | POA: Diagnosis not present

## 2020-11-26 ENCOUNTER — Other Ambulatory Visit: Payer: Self-pay

## 2020-11-26 ENCOUNTER — Ambulatory Visit
Admission: RE | Admit: 2020-11-26 | Discharge: 2020-11-26 | Disposition: A | Discharge status: Home / Self Care | Payer: BC Managed Care – PPO | Source: Ambulatory Visit | Attending: Obstetrics and Gynecology | Admitting: Obstetrics and Gynecology

## 2020-11-26 DIAGNOSIS — N951 Menopausal and female climacteric states: Secondary | ICD-10-CM

## 2020-11-26 DIAGNOSIS — Z780 Asymptomatic menopausal state: Secondary | ICD-10-CM | POA: Diagnosis not present

## 2020-11-26 DIAGNOSIS — M85851 Other specified disorders of bone density and structure, right thigh: Secondary | ICD-10-CM | POA: Diagnosis not present

## 2020-12-04 DIAGNOSIS — R438 Other disturbances of smell and taste: Secondary | ICD-10-CM | POA: Diagnosis not present

## 2020-12-04 DIAGNOSIS — R430 Anosmia: Secondary | ICD-10-CM | POA: Diagnosis not present

## 2020-12-04 DIAGNOSIS — J342 Deviated nasal septum: Secondary | ICD-10-CM | POA: Diagnosis not present

## 2020-12-04 DIAGNOSIS — U099 Post covid-19 condition, unspecified: Secondary | ICD-10-CM | POA: Diagnosis not present

## 2021-01-19 DIAGNOSIS — J3089 Other allergic rhinitis: Secondary | ICD-10-CM | POA: Diagnosis not present

## 2021-01-19 DIAGNOSIS — R0981 Nasal congestion: Secondary | ICD-10-CM | POA: Diagnosis not present

## 2021-01-19 DIAGNOSIS — E559 Vitamin D deficiency, unspecified: Secondary | ICD-10-CM | POA: Diagnosis not present

## 2021-01-19 DIAGNOSIS — I10 Essential (primary) hypertension: Secondary | ICD-10-CM | POA: Diagnosis not present

## 2021-03-12 DIAGNOSIS — R1012 Left upper quadrant pain: Secondary | ICD-10-CM | POA: Diagnosis not present

## 2021-03-12 DIAGNOSIS — Z6828 Body mass index (BMI) 28.0-28.9, adult: Secondary | ICD-10-CM | POA: Diagnosis not present

## 2021-03-12 DIAGNOSIS — I10 Essential (primary) hypertension: Secondary | ICD-10-CM | POA: Diagnosis not present

## 2021-04-09 DIAGNOSIS — Z23 Encounter for immunization: Secondary | ICD-10-CM | POA: Diagnosis not present

## 2021-05-03 DIAGNOSIS — J019 Acute sinusitis, unspecified: Secondary | ICD-10-CM | POA: Diagnosis not present

## 2021-06-08 DIAGNOSIS — J101 Influenza due to other identified influenza virus with other respiratory manifestations: Secondary | ICD-10-CM | POA: Diagnosis not present

## 2021-06-28 DIAGNOSIS — Z8709 Personal history of other diseases of the respiratory system: Secondary | ICD-10-CM | POA: Diagnosis not present

## 2021-06-28 DIAGNOSIS — Z87898 Personal history of other specified conditions: Secondary | ICD-10-CM | POA: Diagnosis not present

## 2021-06-28 DIAGNOSIS — R0683 Snoring: Secondary | ICD-10-CM | POA: Diagnosis not present

## 2021-06-28 DIAGNOSIS — Z8616 Personal history of COVID-19: Secondary | ICD-10-CM | POA: Diagnosis not present

## 2021-07-15 DIAGNOSIS — L720 Epidermal cyst: Secondary | ICD-10-CM | POA: Diagnosis not present

## 2021-07-15 DIAGNOSIS — I10 Essential (primary) hypertension: Secondary | ICD-10-CM | POA: Diagnosis not present

## 2021-07-15 DIAGNOSIS — R300 Dysuria: Secondary | ICD-10-CM | POA: Diagnosis not present

## 2021-07-23 DIAGNOSIS — E785 Hyperlipidemia, unspecified: Secondary | ICD-10-CM | POA: Diagnosis not present

## 2021-07-23 DIAGNOSIS — Z0000 Encounter for general adult medical examination without abnormal findings: Secondary | ICD-10-CM | POA: Diagnosis not present

## 2021-07-23 DIAGNOSIS — Z6829 Body mass index (BMI) 29.0-29.9, adult: Secondary | ICD-10-CM | POA: Diagnosis not present

## 2021-08-02 DIAGNOSIS — R918 Other nonspecific abnormal finding of lung field: Secondary | ICD-10-CM | POA: Diagnosis not present

## 2021-08-02 DIAGNOSIS — R911 Solitary pulmonary nodule: Secondary | ICD-10-CM | POA: Diagnosis not present

## 2021-08-02 DIAGNOSIS — I2510 Atherosclerotic heart disease of native coronary artery without angina pectoris: Secondary | ICD-10-CM | POA: Diagnosis not present

## 2021-08-24 DIAGNOSIS — Z1231 Encounter for screening mammogram for malignant neoplasm of breast: Secondary | ICD-10-CM | POA: Diagnosis not present

## 2021-08-24 DIAGNOSIS — Z6830 Body mass index (BMI) 30.0-30.9, adult: Secondary | ICD-10-CM | POA: Diagnosis not present

## 2021-08-24 DIAGNOSIS — Z124 Encounter for screening for malignant neoplasm of cervix: Secondary | ICD-10-CM | POA: Diagnosis not present

## 2021-08-24 DIAGNOSIS — Z01411 Encounter for gynecological examination (general) (routine) with abnormal findings: Secondary | ICD-10-CM | POA: Diagnosis not present

## 2021-08-24 DIAGNOSIS — Z01419 Encounter for gynecological examination (general) (routine) without abnormal findings: Secondary | ICD-10-CM | POA: Diagnosis not present

## 2021-09-02 DIAGNOSIS — N3289 Other specified disorders of bladder: Secondary | ICD-10-CM | POA: Diagnosis not present

## 2021-09-02 DIAGNOSIS — N281 Cyst of kidney, acquired: Secondary | ICD-10-CM | POA: Diagnosis not present

## 2021-09-02 DIAGNOSIS — R3129 Other microscopic hematuria: Secondary | ICD-10-CM | POA: Diagnosis not present

## 2021-09-15 DIAGNOSIS — L814 Other melanin hyperpigmentation: Secondary | ICD-10-CM | POA: Diagnosis not present

## 2021-09-15 DIAGNOSIS — L821 Other seborrheic keratosis: Secondary | ICD-10-CM | POA: Diagnosis not present

## 2021-09-15 DIAGNOSIS — L578 Other skin changes due to chronic exposure to nonionizing radiation: Secondary | ICD-10-CM | POA: Diagnosis not present

## 2021-09-15 DIAGNOSIS — D225 Melanocytic nevi of trunk: Secondary | ICD-10-CM | POA: Diagnosis not present

## 2021-10-21 DIAGNOSIS — E785 Hyperlipidemia, unspecified: Secondary | ICD-10-CM | POA: Diagnosis not present

## 2021-10-21 DIAGNOSIS — I10 Essential (primary) hypertension: Secondary | ICD-10-CM | POA: Diagnosis not present

## 2021-10-21 DIAGNOSIS — F411 Generalized anxiety disorder: Secondary | ICD-10-CM | POA: Diagnosis not present

## 2021-10-21 DIAGNOSIS — E559 Vitamin D deficiency, unspecified: Secondary | ICD-10-CM | POA: Diagnosis not present

## 2022-01-20 DIAGNOSIS — L301 Dyshidrosis [pompholyx]: Secondary | ICD-10-CM | POA: Diagnosis not present

## 2022-01-20 DIAGNOSIS — R911 Solitary pulmonary nodule: Secondary | ICD-10-CM | POA: Diagnosis not present

## 2022-01-20 DIAGNOSIS — Z1331 Encounter for screening for depression: Secondary | ICD-10-CM | POA: Diagnosis not present

## 2022-01-20 DIAGNOSIS — E785 Hyperlipidemia, unspecified: Secondary | ICD-10-CM | POA: Diagnosis not present

## 2022-01-20 DIAGNOSIS — I10 Essential (primary) hypertension: Secondary | ICD-10-CM | POA: Diagnosis not present

## 2022-03-02 DIAGNOSIS — S99912A Unspecified injury of left ankle, initial encounter: Secondary | ICD-10-CM | POA: Diagnosis not present

## 2022-04-22 DIAGNOSIS — I10 Essential (primary) hypertension: Secondary | ICD-10-CM | POA: Diagnosis not present

## 2022-04-22 DIAGNOSIS — E559 Vitamin D deficiency, unspecified: Secondary | ICD-10-CM | POA: Diagnosis not present

## 2022-04-22 DIAGNOSIS — E785 Hyperlipidemia, unspecified: Secondary | ICD-10-CM | POA: Diagnosis not present

## 2022-04-22 DIAGNOSIS — Z23 Encounter for immunization: Secondary | ICD-10-CM | POA: Diagnosis not present

## 2022-04-22 DIAGNOSIS — F411 Generalized anxiety disorder: Secondary | ICD-10-CM | POA: Diagnosis not present

## 2022-05-10 DIAGNOSIS — R21 Rash and other nonspecific skin eruption: Secondary | ICD-10-CM | POA: Diagnosis not present

## 2022-06-23 DIAGNOSIS — H02884 Meibomian gland dysfunction left upper eyelid: Secondary | ICD-10-CM | POA: Diagnosis not present

## 2022-07-06 DIAGNOSIS — J029 Acute pharyngitis, unspecified: Secondary | ICD-10-CM | POA: Diagnosis not present

## 2022-07-22 DIAGNOSIS — Z0000 Encounter for general adult medical examination without abnormal findings: Secondary | ICD-10-CM | POA: Diagnosis not present

## 2022-07-22 DIAGNOSIS — E785 Hyperlipidemia, unspecified: Secondary | ICD-10-CM | POA: Diagnosis not present

## 2022-07-27 DIAGNOSIS — R911 Solitary pulmonary nodule: Secondary | ICD-10-CM | POA: Diagnosis not present

## 2022-07-27 DIAGNOSIS — R918 Other nonspecific abnormal finding of lung field: Secondary | ICD-10-CM | POA: Diagnosis not present

## 2022-08-23 DIAGNOSIS — E042 Nontoxic multinodular goiter: Secondary | ICD-10-CM | POA: Diagnosis not present

## 2022-08-23 DIAGNOSIS — E041 Nontoxic single thyroid nodule: Secondary | ICD-10-CM | POA: Diagnosis not present

## 2022-08-30 DIAGNOSIS — Z124 Encounter for screening for malignant neoplasm of cervix: Secondary | ICD-10-CM | POA: Diagnosis not present

## 2022-08-30 DIAGNOSIS — Z01419 Encounter for gynecological examination (general) (routine) without abnormal findings: Secondary | ICD-10-CM | POA: Diagnosis not present

## 2022-08-30 DIAGNOSIS — Z1231 Encounter for screening mammogram for malignant neoplasm of breast: Secondary | ICD-10-CM | POA: Diagnosis not present

## 2022-09-02 ENCOUNTER — Other Ambulatory Visit: Payer: Self-pay | Admitting: Obstetrics and Gynecology

## 2022-09-02 DIAGNOSIS — E2839 Other primary ovarian failure: Secondary | ICD-10-CM

## 2022-09-06 ENCOUNTER — Other Ambulatory Visit: Payer: Self-pay

## 2022-09-06 DIAGNOSIS — T7840XA Allergy, unspecified, initial encounter: Secondary | ICD-10-CM | POA: Insufficient documentation

## 2022-09-06 DIAGNOSIS — K589 Irritable bowel syndrome without diarrhea: Secondary | ICD-10-CM | POA: Insufficient documentation

## 2022-09-06 DIAGNOSIS — F419 Anxiety disorder, unspecified: Secondary | ICD-10-CM | POA: Insufficient documentation

## 2022-09-12 NOTE — Progress Notes (Unsigned)
Cardiology Office Note:    Date:  09/13/2022   ID:  Gabrielle Wilkerson, DOB 1966/04/10, MRN [id number]  PCP:  Lowella Dandy, NP  Cardiologist:  Shirlee More, MD   Referring MD: Lowella Dandy, NP  ASSESSMENT:    1. Coronary artery calcification seen on CAT scan   2. Pulmonary nodules    PLAN:    In order of problems listed above:  I reviewed with her by itself atherosclerosis on imaging is not uncommon seen approximately 50% of people over age 55 even at high risk patients it does mean that she should take lipid-lowering therapy and she did not want permanent total statin qualitatively is described as mild or minor she is not having symptoms of chest pain ischemia angina and think she requires cardiac CTA. 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  Concerns regarding medicines are outlined above.  Orders Placed This Encounter  Procedures   Lipoprotein A (LPA)   EKG 12-Lead   No orders of the defined types were placed in this encounter.    Chief Complaint  Patient presents with   Coronary artery calcification on CT scan    History of Present Illness:    Gabrielle Wilkerson is a 57 y.o. female who is being seen today for the evaluation of coronary artery calcification on recent CT scan last month for pulmonary nodules at the request of [name], [name], NP. With descriptive term use by radiology this is a few areas of coronary calcification I would interpret that clinically as mild or minor. Recent labs 07/22/2022 creatinine 0.68 GFR 107 cc/min normal liver function test hemoglobin 13.8  platelets 270,000 lipid profile with cholesterol 176 LDL 102 triglycerides 93 HDL 57 non-HDL cholesterol 119  She has been on long-term lipid-lowering therapy for several years She has no known history of heart disease congenital rheumatic or atrial fibrillation Per father had congenital heart disease aortic valve replacement in his 40s and died in his [id number] as a complication of thromboembolism noncompliant with his anticoagulants She is seen a cardiologist in her late 34s for palpitation were monitored no abnormality was seen She has no episodes of chest pain edema shortness of breath or syncope She was unaware that her CT of the abdomen in 2020 showed aortic atherosclerosis She is a previous smoker having smoked 20 pack years stopping at age 70  I was able to obtain CT of her chest performed 08/02/2021 which also showed coronary artery calcifications described as occasional I would interpret this as minimal to mild.  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   atorvastatin (LIPITOR) 10 MG tablet Take 10 mg by mouth daily.   Calcium Carb-Cholecalciferol (CALCIUM 500 + D3 PO) Take 1,000 mg by mouth at bedtime.   Cholecalciferol (VITAMIN D-3) 1000 units CAPS Take 5,000 Units by mouth daily.    clobetasol cream (TEMOVATE) [id number] % Apply 1 Application topically 2 (two) times daily.   lisinopril (ZESTRIL) 2.5 MG tablet Take 2.5 mg by mouth daily.   NON FORMULARY Juice Plus-Take 8 pills daily     Allergies:   Sulfa antibiotics   Social  History   Socioeconomic History   Marital status: Married    Spouse name: Not on file   Number of children: Not on file   Years of education: Not on file   Highest education level: Not on file  Occupational History   Not on file  Tobacco Use   Smoking status: Former    Types: Cigarettes    Quit date: 06/14/2003    Years since quitting: 19.2   Smokeless tobacco: Never  Substance and Sexual Activity   Alcohol use: Not Currently   Drug use: Not Currently   Sexual activity: Yes  Other Topics Concern   Not on file  Social History Narrative   Not on file   Social Determinants of Health   Financial Resource Strain: Not on file  Food Insecurity: Not on file  Transportation Needs: Not on file  Physical Activity: Not on file  Stress: Not on file  Social Connections: Not on file     Family History: The patient's family history includes Alzheimer's disease in her mother; Angina in her father; Colon polyps in her mother; Diabetes in her father; Heart Problems in her father; Hypertension in her father. There is no history of Colon cancer, Esophageal cancer, Stomach cancer, or Rectal cancer.  ROS:   ROS Please see the history of present illness.     All other systems reviewed and are negative.  EKGs/Labs/Other Studies Reviewed:    The following studies were reviewed today:       EKG:  EKG is  ordered today.  The ekg ordered today is personally reviewed and demonstrates sinus rhythm with a normal   Physical Exam:    VS:  BP 116/72 (BP Location: Right Arm, Patient Position: Sitting)   Pulse 75   Ht 5\' 7"  (1.702 m)   Wt 183 lb 9.6 oz (83.3 kg)   LMP 07/29/2010   SpO2 98%   BMI 28.76 kg/m     Wt Readings from Last 3 Encounters:  09/13/22 183 lb 9.6 oz (83.3 kg)  11/26/18 180 lb (81.6 kg)  11/13/18 185 lb (83.9 kg)     GEN:  Well nourished, well developed in no acute distress HEENT: Normal NECK: No JVD; No carotid bruits LYMPHATICS: No lymphadenopathy CARDIAC: RRR,  no murmurs, rubs, gallops RESPIRATORY:  Clear to auscultation without rales, wheezing or rhonchi  ABDOMEN: Soft, non-tender, non-distended MUSCULOSKELETAL:  No edema; No deformity  SKIN: Warm and dry NEUROLOGIC:  Alert and oriented x 3 PSYCHIATRIC:  Normal affect     Signed, Shirlee More, MD  09/13/2022 9:40 AM    [place]

## 2022-09-13 ENCOUNTER — Ambulatory Visit: Payer: BC Managed Care – PPO | Attending: Cardiology | Admitting: Cardiology

## 2022-09-13 ENCOUNTER — Encounter: Payer: Self-pay | Admitting: Cardiology

## 2022-09-13 VITALS — BP 116/72 | HR 75 | Ht 67.0 in | Wt 183.6 lb

## 2022-09-13 DIAGNOSIS — I2510 Atherosclerotic heart disease of native coronary artery without angina pectoris: Secondary | ICD-10-CM

## 2022-09-13 DIAGNOSIS — R918 Other nonspecific abnormal finding of lung field: Secondary | ICD-10-CM | POA: Diagnosis not present

## 2022-09-13 NOTE — Patient Instructions (Signed)
Medication Instructions:  Your physician recommends that you continue on your current medications as directed. Please refer to the Current Medication list given to you today.  *If you need a refill on your cardiac medications before your next appointment, please call your pharmacy*   Lab Work: Your physician recommends that you return for lab work in:   Labs today: [facility]  If you have labs (blood work) drawn today and your tests are completely normal, you will receive your results only by: [place] (if you have [place]) OR A paper copy in the mail If you have any lab test that is abnormal or we need to change your treatment, we will call you to review the results.   Testing/Procedures: None   Follow-Up: At [facility], you and your health needs are our priority.  As part of our continuing mission to provide you with exceptional heart care, we have created designated Provider Care Teams.  These Care Teams include your primary Cardiologist (physician) and Advanced Practice Providers (APPs -  Physician Assistants and Nurse Practitioners) who all work together to provide you with the care you need, when you need it.  We recommend signing up for the patient portal called "MyChart".  Sign up information is provided on this After Visit Summary.  MyChart is used to connect with patients for Virtual Visits (Telemedicine).  Patients are able to view lab/test results, encounter notes, upcoming appointments, etc.  Non-urgent messages can be sent to your provider as well.   To learn more about what you can do with MyChart, go to [email or web address].    Your next appointment:   1 year(s)  Provider:   Shirlee More, MD    Other Instructions None

## 2022-09-15 LAB — LIPOPROTEIN A (LPA): Lipoprotein (a): 46.3 nmol/L (ref <75.0)

## 2022-09-28 DIAGNOSIS — L814 Other melanin hyperpigmentation: Secondary | ICD-10-CM | POA: Diagnosis not present

## 2022-09-28 DIAGNOSIS — D225 Melanocytic nevi of trunk: Secondary | ICD-10-CM | POA: Diagnosis not present

## 2022-09-28 DIAGNOSIS — D2239 Melanocytic nevi of other parts of face: Secondary | ICD-10-CM | POA: Diagnosis not present

## 2022-09-28 DIAGNOSIS — D485 Neoplasm of uncertain behavior of skin: Secondary | ICD-10-CM | POA: Diagnosis not present

## 2022-09-28 DIAGNOSIS — L821 Other seborrheic keratosis: Secondary | ICD-10-CM | POA: Diagnosis not present

## 2022-10-03 DIAGNOSIS — M545 Low back pain, unspecified: Secondary | ICD-10-CM | POA: Diagnosis not present

## 2022-10-13 DIAGNOSIS — M5459 Other low back pain: Secondary | ICD-10-CM | POA: Diagnosis not present

## 2022-10-13 DIAGNOSIS — M25551 Pain in right hip: Secondary | ICD-10-CM | POA: Diagnosis not present

## 2022-10-13 DIAGNOSIS — M461 Sacroiliitis, not elsewhere classified: Secondary | ICD-10-CM | POA: Diagnosis not present

## 2022-10-17 DIAGNOSIS — M461 Sacroiliitis, not elsewhere classified: Secondary | ICD-10-CM | POA: Diagnosis not present

## 2022-10-17 DIAGNOSIS — M25551 Pain in right hip: Secondary | ICD-10-CM | POA: Diagnosis not present

## 2022-10-17 DIAGNOSIS — M5459 Other low back pain: Secondary | ICD-10-CM | POA: Diagnosis not present

## 2022-10-24 DIAGNOSIS — M25551 Pain in right hip: Secondary | ICD-10-CM | POA: Diagnosis not present

## 2022-10-24 DIAGNOSIS — M461 Sacroiliitis, not elsewhere classified: Secondary | ICD-10-CM | POA: Diagnosis not present

## 2022-10-24 DIAGNOSIS — M5459 Other low back pain: Secondary | ICD-10-CM | POA: Diagnosis not present

## 2022-10-25 DIAGNOSIS — N3289 Other specified disorders of bladder: Secondary | ICD-10-CM | POA: Diagnosis not present

## 2022-10-25 DIAGNOSIS — N281 Cyst of kidney, acquired: Secondary | ICD-10-CM | POA: Diagnosis not present

## 2022-10-25 DIAGNOSIS — R3129 Other microscopic hematuria: Secondary | ICD-10-CM | POA: Diagnosis not present

## 2022-11-08 DIAGNOSIS — M25551 Pain in right hip: Secondary | ICD-10-CM | POA: Diagnosis not present

## 2022-11-08 DIAGNOSIS — M5459 Other low back pain: Secondary | ICD-10-CM | POA: Diagnosis not present

## 2022-11-08 DIAGNOSIS — M461 Sacroiliitis, not elsewhere classified: Secondary | ICD-10-CM | POA: Diagnosis not present

## 2022-12-07 DIAGNOSIS — H1045 Other chronic allergic conjunctivitis: Secondary | ICD-10-CM | POA: Diagnosis not present

## 2023-01-20 DIAGNOSIS — E785 Hyperlipidemia, unspecified: Secondary | ICD-10-CM | POA: Diagnosis not present

## 2023-01-20 DIAGNOSIS — R911 Solitary pulmonary nodule: Secondary | ICD-10-CM | POA: Diagnosis not present

## 2023-01-20 DIAGNOSIS — E559 Vitamin D deficiency, unspecified: Secondary | ICD-10-CM | POA: Diagnosis not present

## 2023-01-20 DIAGNOSIS — E041 Nontoxic single thyroid nodule: Secondary | ICD-10-CM | POA: Diagnosis not present

## 2023-01-20 DIAGNOSIS — I10 Essential (primary) hypertension: Secondary | ICD-10-CM | POA: Diagnosis not present

## 2023-03-08 ENCOUNTER — Ambulatory Visit
Admission: RE | Admit: 2023-03-08 | Discharge: 2023-03-08 | Disposition: A | Discharge status: Home / Self Care | Payer: BC Managed Care – PPO | Source: Ambulatory Visit | Attending: Obstetrics and Gynecology | Admitting: Obstetrics and Gynecology

## 2023-03-08 DIAGNOSIS — E2839 Other primary ovarian failure: Secondary | ICD-10-CM

## 2023-03-08 DIAGNOSIS — N958 Other specified menopausal and perimenopausal disorders: Secondary | ICD-10-CM | POA: Diagnosis not present

## 2023-03-08 DIAGNOSIS — E349 Endocrine disorder, unspecified: Secondary | ICD-10-CM | POA: Diagnosis not present

## 2023-04-07 DIAGNOSIS — Z23 Encounter for immunization: Secondary | ICD-10-CM | POA: Diagnosis not present

## 2023-05-15 ENCOUNTER — Telehealth: Payer: Self-pay | Admitting: Cardiology

## 2023-05-15 NOTE — Telephone Encounter (Signed)
Gabrielle Wilkerson is a pt of yours and has Lipo protein. Pt's daughter (18 yr) pediatrician wants to know if your recommend her being seen for the same. Please advise

## 2023-05-15 NOTE — Telephone Encounter (Signed)
Called Marcelino Duster at Dr. Claris Che office and she reported that they had performed an Lpa on the patient's daughter and they wanted to know if Dr. Dulce Sellar wanted to see her for the same reasons as her mother (Dr. Hulen Shouts patient). I had them fax the results to the office for Dr. Dulce Sellar to evaluate. Marcelino Duster had no further questions at this time.

## 2023-05-26 NOTE — Telephone Encounter (Signed)
Marcelino Duster from Dr. Claris Che Office is requesting to speak with RN Gerlene Burdock. Please advise.

## 2023-05-29 NOTE — Telephone Encounter (Signed)
Left message for the patient to call back.

## 2023-05-29 NOTE — Telephone Encounter (Signed)
Patient's daughter has an appointment to see Dr. Dulce Sellar on 08/21/23 at 8:40 am.

## 2023-05-29 NOTE — Telephone Encounter (Signed)
Called the patient and explained that her daughter could set-up an appointment with Dr. Dulce Sellar but it was not an emergency. I explained that I would have someone from the front office team call her to schedule her daughter's appointment.

## 2023-05-29 NOTE — Telephone Encounter (Signed)
Dr. Clent Ridges office calling back

## 2023-05-29 NOTE — Telephone Encounter (Signed)
Dr. Claris Che calling back for a response.

## 2023-07-28 DIAGNOSIS — Z6829 Body mass index (BMI) 29.0-29.9, adult: Secondary | ICD-10-CM | POA: Diagnosis not present

## 2023-07-28 DIAGNOSIS — E559 Vitamin D deficiency, unspecified: Secondary | ICD-10-CM | POA: Diagnosis not present

## 2023-07-28 DIAGNOSIS — E785 Hyperlipidemia, unspecified: Secondary | ICD-10-CM | POA: Diagnosis not present

## 2023-07-28 DIAGNOSIS — Z0000 Encounter for general adult medical examination without abnormal findings: Secondary | ICD-10-CM | POA: Diagnosis not present

## 2023-07-29 LAB — CMP 10231
A1c: 5.5
EGFR: 100

## 2023-07-31 ENCOUNTER — Other Ambulatory Visit (HOSPITAL_BASED_OUTPATIENT_CLINIC_OR_DEPARTMENT_OTHER): Payer: Self-pay | Admitting: Internal Medicine

## 2023-07-31 DIAGNOSIS — R911 Solitary pulmonary nodule: Secondary | ICD-10-CM

## 2023-07-31 DIAGNOSIS — E041 Nontoxic single thyroid nodule: Secondary | ICD-10-CM

## 2023-08-28 ENCOUNTER — Ambulatory Visit (INDEPENDENT_AMBULATORY_CARE_PROVIDER_SITE_OTHER)
Admission: RE | Admit: 2023-08-28 | Discharge: 2023-08-28 | Disposition: A | Discharge status: Home / Self Care | Payer: BC Managed Care – PPO | Source: Ambulatory Visit | Attending: Internal Medicine | Admitting: Internal Medicine

## 2023-08-28 ENCOUNTER — Ambulatory Visit (HOSPITAL_BASED_OUTPATIENT_CLINIC_OR_DEPARTMENT_OTHER)
Admission: RE | Admit: 2023-08-28 | Discharge: 2023-08-28 | Disposition: A | Discharge status: Home / Self Care | Payer: BC Managed Care – PPO | Source: Ambulatory Visit | Attending: Internal Medicine | Admitting: Internal Medicine

## 2023-08-28 DIAGNOSIS — E041 Nontoxic single thyroid nodule: Secondary | ICD-10-CM | POA: Diagnosis not present

## 2023-08-28 DIAGNOSIS — R918 Other nonspecific abnormal finding of lung field: Secondary | ICD-10-CM | POA: Diagnosis not present

## 2023-08-28 DIAGNOSIS — R911 Solitary pulmonary nodule: Secondary | ICD-10-CM | POA: Diagnosis not present

## 2023-08-28 DIAGNOSIS — I700 Atherosclerosis of aorta: Secondary | ICD-10-CM | POA: Diagnosis not present

## 2023-08-28 DIAGNOSIS — E042 Nontoxic multinodular goiter: Secondary | ICD-10-CM | POA: Diagnosis not present

## 2023-09-05 DIAGNOSIS — Z1331 Encounter for screening for depression: Secondary | ICD-10-CM | POA: Diagnosis not present

## 2023-09-05 DIAGNOSIS — Z1231 Encounter for screening mammogram for malignant neoplasm of breast: Secondary | ICD-10-CM | POA: Diagnosis not present

## 2023-09-05 DIAGNOSIS — R5383 Other fatigue: Secondary | ICD-10-CM | POA: Diagnosis not present

## 2023-09-05 DIAGNOSIS — Z124 Encounter for screening for malignant neoplasm of cervix: Secondary | ICD-10-CM | POA: Diagnosis not present

## 2023-09-05 DIAGNOSIS — Z01419 Encounter for gynecological examination (general) (routine) without abnormal findings: Secondary | ICD-10-CM | POA: Diagnosis not present

## 2023-09-06 LAB — COMPREHENSIVE METABOLIC PANEL WITH GFR: EGFR: 99

## 2023-09-12 ENCOUNTER — Encounter: Payer: Self-pay | Admitting: Gastroenterology

## 2023-09-15 ENCOUNTER — Ambulatory Visit: Payer: BC Managed Care – PPO | Admitting: Cardiology

## 2023-09-15 ENCOUNTER — Ambulatory Visit

## 2023-09-15 VITALS — BP 122/76 | HR 67 | Ht 67.0 in | Wt 190.4 lb

## 2023-09-15 DIAGNOSIS — I2510 Atherosclerotic heart disease of native coronary artery without angina pectoris: Secondary | ICD-10-CM | POA: Diagnosis not present

## 2023-09-15 DIAGNOSIS — I10 Essential (primary) hypertension: Secondary | ICD-10-CM | POA: Diagnosis not present

## 2023-09-15 NOTE — Patient Instructions (Signed)

## 2023-09-15 NOTE — Assessment & Plan Note (Addendum)
 Minimal coronary artery calcifications on prior CT chest from February 2023.   No mention of coronary calcifications on CT chest imaging report from 08-28-2023.  I reviewed these images myself and do not appreciate much coronary calcification.  Minimal aortic atherosclerosis observed. Reassured about the findings showing no major coronary calcification burden.  She has been on statin regimen Crestor 10 mg once daily for multiple years. Tolerating well. Recent lipid panel with LDL above 100 and February 2025.  She has excellent functional capacity, exercises regularly and made dietary modifications. Encouraged to continue with healthy dietary and lifestyle modifications. Given her lipoprotein a levels in the past and minimal coronary artery calcifications reported, reasonable to continue with low-dose Crestor as above as it has the added benefit of maturing plaque making it less vulnerable.  We discussed if at subsequent follow-up visit in 1 year if her LDL levels are excellent below 70 mg/dL and she would like to wean herself off statins or reduce the frequency, it would be reasonable.

## 2023-09-15 NOTE — Assessment & Plan Note (Signed)
 Well-controlled. Currently on a very small dose lisinopril 2.5 mg once daily. Target below 130/80 mmHg. She was hoping to wean herself off lisinopril. With her ongoing exercise, diet modifications and her recent weight loss over 10 pounds, if she is able to sustain this should be reasonable to begin her off lisinopril and monitor for blood pressures off medications.  Suggested she keep a blood pressure log for a week consistently. If the blood pressures are consistently below 120/80 mmHg she can stop the lisinopril and continue to monitor her blood pressures for another couple weeks.  If the blood pressures are consistently below 130/80 mmHg off lisinopril, she can continue to stay off antihypertensives. She understands if this is not the case she will resume the lisinopril for blood pressure optimization.

## 2023-09-15 NOTE — Progress Notes (Addendum)
 Cardiology Consultation:    Date:  09/15/2023   ID:  Gabrielle Wilkerson, DOB [birth year], MRN [id number]  PCP:  Gabrielle Party, NP  Cardiologist:  Gabrielle Murphy, MD   Referring MD: Gabrielle Party, NP   No chief complaint on file.    ASSESSMENT AND PLAN:   Gabrielle Wilkerson 58 year old woman with minimal coronary artery calcification noted on CT chest imaging from February 2023 at [facility] and evidence of aortic atherosclerosis on CT chest and abdominal imaging, dyslipidemia with elevated lipoprotein Wilkerson levels, hypertension, former smoker, was seen previously by Dr. Dulce Wilkerson and here for follow-up visit.  She has been taking statins even prior to visit with Dr. Dulce Wilkerson.  Problem List Items Addressed This Visit     Coronary artery calcification seen on CAT scan - Primary   Minimal coronary artery calcifications on prior CT chest from February 2023.   No mention of coronary calcifications on CT chest imaging report from 08-28-2023.  I reviewed these images myself and do not appreciate much coronary calcification.  Minimal aortic atherosclerosis observed. Reassured about the findings showing no major coronary calcification burden.  She has been on statin regimen Crestor 10 mg once daily for multiple years. Tolerating well. Recent lipid panel with LDL above 100 and February 2025.  She has excellent functional capacity, exercises regularly and made dietary modifications. Encouraged to continue with healthy dietary and lifestyle modifications. Given her lipoprotein Wilkerson levels in the past and minimal coronary artery calcifications reported, reasonable to continue with low-dose Crestor as above as it has the added benefit of maturing plaque making it less vulnerable.  We discussed if at subsequent follow-up visit in 1 year if her LDL levels are excellent below 70 mg/dL and she would like to wean herself off statins or reduce the frequency, it would be reasonable.        Relevant  Medications   rosuvastatin (CRESTOR) 10 MG tablet   Other Relevant Orders   EKG 12-Lead (Completed)   Hypertension   Well-controlled. Currently on Wilkerson very small dose lisinopril 2.5 mg once daily. Target below 130/80 mmHg. She was hoping to wean herself off lisinopril. With her ongoing exercise, diet modifications and her recent weight loss over 10 pounds, if she is able to sustain this should be reasonable to begin her off lisinopril and monitor for blood pressures off medications.  Suggested she keep Wilkerson blood pressure log for Wilkerson week consistently. If the blood pressures are consistently below 120/80 mmHg she can stop the lisinopril and continue to monitor her blood pressures for another couple weeks.  If the blood pressures are consistently below 130/80 mmHg off lisinopril, she can continue to stay off antihypertensives. She understands if this is not the case she will resume the lisinopril for blood pressure optimization.      Relevant Medications   rosuvastatin (CRESTOR) 10 MG tablet   Return to clinic tentatively in 1 year as she would like to discuss further lipid management based on the updated numbers.   History of Present Illness:    Gabrielle Wilkerson is Wilkerson 58 y.o. female who is being seen today for follow-up visit. Last visit at our office was 09-13-2022 with Dr. Dulce Wilkerson. PCP is [name], [name], NP.  Pleasant woman here for the visit by herself.  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  Coronary artery calcifications minimal reported on prior CT chest imaging from February 20[facility] at [facility], dyslipidemia, aortic atherosclerosis noted  on prior CT chest imaging and once again on more recent CT chest imaging from March 2025, hypertension.  Mentions overall she is doing well.  Since the start of the year she has been [facility] [facility] and modified her diet significantly and lost up to 10 pounds.  Doing her activities well without any  significant chest pain, shortness of breath. Taking her medications consistently. She was wondering if she would be able to wean herself off of lisinopril given her blood pressures being well-controlled.  Similarly she was wondering if she could wean down on the statins.  Quit smoking in 2005. Drinks alcohol rarely on social occasions. No substance abuse.  EKG in the clinic today shows sinus rhythm heart rate 67/min, normal PR interval, normal QRS duration 84 ms, QTc normal 433 ms.  For pulmonary nodules she has regular screening CT chest.  Most recent one was September 11, 2023 that showed aortic atherosclerosis.  Recent lipid panel from February 2025 she brought to the visit today at the office noted LDL levels 108.  Rest of her lab work BMP, CBC unremarkable.  Addendum 09/21/2023:  Reviewed these labs labs from 01/20/2023 at the office visit, document scanned into the chart. Lipoprotein Wilkerson 52.7 and apolipoprotein B 67 LDL-particle number 1015 LDL 84, HDL 64, total cholesterol 161, triglycerides 145  Labs from 09-05-2023 with BUN 14, creatinine 0.72, EGFR 99 Sodium 141, potassium 4.4 Normal transaminases and alkaline phosphatase  Past Medical History:  Diagnosis Date   Allergy    seasonal   Anxiety    IBS (irritable bowel syndrome)    Trochanteric bursitis of right hip 01/14/2020    Past Surgical History:  Procedure Laterality Date   COLONOSCOPY     was 58 years old   LAPAROSCOPY  age 59    of ovaries   SINOSCOPY  2008   Dr Shelva Majestic    Current Medications: Current Meds  Medication Sig   ALPRAZolam (XANAX) 0.25 MG tablet Take 0.25 mg by mouth at bedtime as needed for anxiety.   CALCIUM PO Take 1,200 mg by mouth daily.   Cholecalciferol (VITAMIN D-3) 1000 units CAPS Take 4,000 Units by mouth daily.   Coenzyme Q10 100 MG TABS Take 100 mg by mouth daily.   lisinopril (ZESTRIL) 2.5 MG tablet Take 2.5 mg by mouth daily.   NON FORMULARY Juice Plus-Take 8 pills daily   Omega-3  Fatty Acids (FISH OIL) 1000 MG CAPS Take 200 mg by mouth daily.   rosuvastatin (CRESTOR) 10 MG tablet Take 10 mg by mouth daily.     Allergies:   Sulfa antibiotics   Social History   Socioeconomic History   Marital status: Married    Spouse name: Not on file   Number of children: Not on file   Years of education: Not on file   Highest education level: Not on file  Occupational History   Not on file  Tobacco Use   Smoking status: Former    Current packs/day: 0.00    Types: Cigarettes    Quit date: 06/14/2003    Years since quitting: 20.2   Smokeless tobacco: Never  Substance and Sexual Activity   Alcohol use: Not Currently   Drug use: Not Currently   Sexual activity: Yes  Other Topics Concern   Not on file  Social History Narrative   Not on file   Social Drivers of Health   Financial Resource Strain: Not on file  Food  Insecurity: Not on file  Transportation Needs: Not on file  Physical Activity: Not on file  Stress: Not on file  Social Connections: Not on file     Family History: The patient's family history includes Alzheimer's disease in her mother; Angina in her father; Colon polyps in her mother; Diabetes in her father; Heart Problems in her father; Hypertension in her father. There is no history of Colon cancer, Esophageal cancer, Stomach cancer, or Rectal cancer. ROS:   Please see the history of present illness.    All 14 point review of systems negative except as described per history of present illness.  EKGs/Labs/Other Studies Reviewed:    The following studies were reviewed today:   EKG:  EKG Interpretation Date/Time:  Friday September 15 2023 16:34:07 EDT Ventricular Rate:  67 PR Interval:  184 QRS Duration:  84 QT Interval:  410 QTC Calculation: 433 R Axis:   77  Text Interpretation: Normal sinus rhythm Normal ECG No previous ECGs available Confirmed by Huntley Dec reddy [phone]) on 09/15/2023 4:52:59 PM    Recent Labs: No results found for  requested labs within last 365 days.  Recent Lipid Panel No results found for: "CHOL", "TRIG", "HDL", "CHOLHDL", "VLDL", "LDLCALC", "LDLDIRECT"  Physical Exam:    VS:  BP 122/76   Pulse 67   Ht 5\' 7"  (1.702 m)   Wt 190 lb 6.4 oz (86.4 kg)   LMP 07/29/2010   SpO2 98%   BMI 29.82 kg/m     Wt Readings from Last 3 Encounters:  09/15/23 190 lb 6.4 oz (86.4 kg)  09/13/22 183 lb 9.6 oz (83.3 kg)  11/26/18 180 lb (81.6 kg)     GENERAL:  Well nourished, well developed in no acute distress NECK: No JVD; No carotid bruits CARDIAC: RRR, S1 and S2 present, no murmurs, no rubs, no gallops CHEST:  Clear to auscultation without rales, wheezing or rhonchi  Extremities: No pitting pedal edema. Pulses bilaterally symmetric with radial 2+ and dorsalis pedis 2+ NEUROLOGIC:  Alert and oriented x 3  Medication Adjustments/Labs and Tests Ordered: Current medicines are reviewed at length with the patient today.  Concerns regarding medicines are outlined above.  Orders Placed This Encounter  Procedures   EKG 12-Lead   No orders of the defined types were placed in this encounter.   Signed, Cecille Amsterdam, MD, MPH, [facility]. 09/15/2023 5:28 PM    [facility]

## 2023-09-27 DIAGNOSIS — D2239 Melanocytic nevi of other parts of face: Secondary | ICD-10-CM | POA: Diagnosis not present

## 2023-09-27 DIAGNOSIS — L237 Allergic contact dermatitis due to plants, except food: Secondary | ICD-10-CM | POA: Diagnosis not present

## 2023-09-27 DIAGNOSIS — D225 Melanocytic nevi of trunk: Secondary | ICD-10-CM | POA: Diagnosis not present

## 2023-09-27 DIAGNOSIS — L814 Other melanin hyperpigmentation: Secondary | ICD-10-CM | POA: Diagnosis not present

## 2023-10-02 ENCOUNTER — Ambulatory Visit (HOSPITAL_BASED_OUTPATIENT_CLINIC_OR_DEPARTMENT_OTHER): Admission: EM | Admit: 2023-10-02 | Discharge: 2023-10-02 | Disposition: A | Discharge status: Home / Self Care

## 2023-10-02 ENCOUNTER — Encounter (HOSPITAL_BASED_OUTPATIENT_CLINIC_OR_DEPARTMENT_OTHER): Payer: Self-pay

## 2023-10-02 DIAGNOSIS — L237 Allergic contact dermatitis due to plants, except food: Secondary | ICD-10-CM | POA: Diagnosis not present

## 2023-10-02 NOTE — Discharge Instructions (Addendum)
 Use the triamcinolone cream that you have been prescribed.  Can continue Benadryl as needed.  Ice as needed.  Follow-up for any continued or worsening issues

## 2023-10-02 NOTE — ED Provider Notes (Signed)
 [facility]    CSN: [id number] Arrival date & time: 10/02/23  0919      History   Chief Complaint Chief Complaint  Patient presents with   Rash    HPI Gabrielle Wilkerson is a 58 y.o. female.   58 year old female presents today with rash.  The rash is located to the right arm, right shoulder, right sided abdomen and left lower extremity.  The rash started approximately 2 weeks ago.  Started after pulling out some brush in her backyard.  She has been doing Benadryl for itching but otherwise nothing to treat the rash.  Was prescribed some triamcinolone cream but never started.  Denies any pain with the rash, fever or chills   Rash   Past Medical History:  Diagnosis Date   Allergy    seasonal   Anxiety    IBS (irritable bowel syndrome)    Trochanteric bursitis of right hip 01/14/2020    Patient Active Problem List   Diagnosis Date Noted   Coronary artery calcification seen on CAT scan 09/15/2023   Hypertension 09/15/2023   IBS (irritable bowel syndrome) 09/06/2022   Anxiety 09/06/2022   Allergy 09/06/2022   Trochanteric bursitis of right hip 01/14/2020    Past Surgical History:  Procedure Laterality Date   COLONOSCOPY     was 58 years old   LAPAROSCOPY  age 40    of ovaries   SINOSCOPY  2008   Dr Stephenie Einstein    OB History   No obstetric history on file.      Home Medications    Prior to Admission medications   Medication Sig Start Date End Date Taking? Authorizing Provider  ALPRAZolam (XANAX) 0.25 MG tablet Take 0.25 mg by mouth at bedtime as needed for anxiety.    [provider]  CALCIUM PO Take 1,200 mg by mouth daily.    [provider]  Cholecalciferol (VITAMIN D-3) 1000 units CAPS Take 4,000 Units by mouth daily.    [provider]  Coenzyme Q10 100 MG TABS Take 100 mg by mouth daily. 01/14/20   [provider]  lisinopril (ZESTRIL) 2.5 MG tablet Take 2.5 mg by mouth daily. 08/22/22   [provider]  NON FORMULARY Juice Plus-Take 8 pills daily    [provider]  Omega-3 Fatty Acids (FISH OIL) 1000 MG CAPS Take 200 mg by mouth daily. 01/14/20   [provider]  rosuvastatin (CRESTOR) 10 MG tablet Take 10 mg by mouth daily. 06/19/23   [provider]    Family History Family History  Problem Relation Age of Onset   Alzheimer's disease Mother    Colon polyps Mother    Heart Problems Father    Hypertension Father    Diabetes Father    Angina Father    Colon cancer Neg Hx    Esophageal cancer Neg Hx    Stomach cancer Neg Hx    Rectal cancer Neg Hx     Social History Social History   Tobacco Use   Smoking status: Former    Current packs/day: 0.00    Types: Cigarettes    Quit date: 06/14/2003    Years since quitting: 20.3   Smokeless tobacco: Never  Substance Use Topics   Alcohol use: Not Currently   Drug use: Not Currently     Allergies   Sulfa antibiotics   Review of Systems Review of Systems  Skin:  Positive for rash.     Physical Exam Triage  Vital Signs ED Triage Vitals [10/02/23 0955]  Encounter Vitals Group     BP 138/78     Systolic BP Percentile      Diastolic BP Percentile      Pulse Rate 71     Resp 20     Temp 98.7 F (37.1 C)     Temp Source Oral     SpO2 98 %     Weight      Height      Head Circumference      Peak Flow      Pain Score 0     Pain Loc      Pain Education      Exclude from Growth Chart    No data found.  Updated Vital Signs BP 138/78 (BP Location: Left Arm)   Pulse 71   Temp 98.7 F (37.1 C) (Oral)   Resp 20   LMP 07/29/2010   SpO2 98%   Visual Acuity Right Eye Distance:   Left Eye Distance:   Bilateral Distance:    Right Eye Near:   Left Eye Near:    Bilateral Near:     Physical Exam Constitutional:      Appearance: Normal appearance.  Pulmonary:     Effort: Pulmonary effort is normal.  Skin:    Findings: Erythema and rash present.     Comments: Papular rash to right  forearm and upper arm. Similar rash to right shoulder/chest area, breast, abdomen Left lower extremity Generalized erythema to the right arm, no swelling.   Neurological:     Mental Status: She is alert.      UC Treatments / Results  Labs (all labs ordered are listed, but only abnormal results are displayed) Labs Reviewed - No data to display  EKG   Radiology No results found.  Procedures Procedures (including critical care time)  Medications Ordered in UC Medications - No data to display  Initial Impression / Assessment and Plan / UC Course  I have reviewed the triage vital signs and the nursing notes.  Pertinent labs & imaging results that were available during my care of the patient were reviewed by me and considered in my medical decision making (see chart for details).     Poison ivy dermatitis-recommended to start using the triamcinolone cream that she has been prescribed by her dermatologist.  She can continue Benadryl as needed. Follow-up as needed Final Clinical Impressions(s) / UC Diagnoses   Final diagnoses:  Poison ivy dermatitis     Discharge Instructions      Use the triamcinolone cream that you have been prescribed.  Can continue Benadryl as needed.  Ice as needed.  Follow-up for any continued or worsening issues    ED Prescriptions   None    PDMP not reviewed this encounter.   Landa Pine, FNP 10/02/23 1024

## 2023-10-02 NOTE — ED Triage Notes (Signed)
 Onset of rash to right arm on April 7th. Patient wasn't sure if it was poison ivy or shingles. Now having rash to right side of trunk, and right shoulder. States very itchy.

## 2023-10-07 ENCOUNTER — Encounter (HOSPITAL_BASED_OUTPATIENT_CLINIC_OR_DEPARTMENT_OTHER): Payer: Self-pay | Admitting: Emergency Medicine

## 2023-10-07 ENCOUNTER — Ambulatory Visit (HOSPITAL_BASED_OUTPATIENT_CLINIC_OR_DEPARTMENT_OTHER)
Admission: EM | Admit: 2023-10-07 | Discharge: 2023-10-07 | Disposition: A | Discharge status: Home / Self Care | Attending: Family Medicine | Admitting: Family Medicine

## 2023-10-07 DIAGNOSIS — R319 Hematuria, unspecified: Secondary | ICD-10-CM | POA: Diagnosis not present

## 2023-10-07 LAB — POCT URINALYSIS DIP (MANUAL ENTRY)
Bilirubin, UA: NEGATIVE
Glucose, UA: NEGATIVE mg/dL
Ketones, POC UA: NEGATIVE mg/dL
Nitrite, UA: POSITIVE — AB
Protein Ur, POC: 100 mg/dL — AB
Spec Grav, UA: 1.015
Urobilinogen, UA: 0.2 U/dL
pH, UA: 7

## 2023-10-07 MED ORDER — NITROFURANTOIN MONOHYD MACRO 100 MG PO CAPS: 100.0000 mg | ORAL_CAPSULE | Freq: Two times a day (BID) | ORAL | 0 refills | Status: DC

## 2023-10-07 NOTE — Discharge Instructions (Signed)
 Treating you for urinary tract infection.  Drink plenty of water.

## 2023-10-07 NOTE — ED Provider Notes (Addendum)
 [facility]    CSN: [id number] Arrival date & time: 10/07/23  0817      History   Chief Complaint Chief Complaint  Patient presents with   Hematuria    HPI Gabrielle Wilkerson is a 58 y.o. female.   Patient is a 58 year old female that presents today with hematuria and dysuria.  Symptoms started this morning.  Symptoms have been constant.  Recently treated for yeast infection.  She is not having any abdominal pain, back pain or fevers.   Hematuria    Past Medical History:  Diagnosis Date   Allergy    seasonal   Anxiety    IBS (irritable bowel syndrome)    Trochanteric bursitis of right hip 01/14/2020    Patient Active Problem List   Diagnosis Date Noted   Coronary artery calcification seen on CAT scan 09/15/2023   Hypertension 09/15/2023   IBS (irritable bowel syndrome) 09/06/2022   Anxiety 09/06/2022   Allergy 09/06/2022   Trochanteric bursitis of right hip 01/14/2020    Past Surgical History:  Procedure Laterality Date   COLONOSCOPY     was 58 years old   LAPAROSCOPY  age 47    of ovaries   SINOSCOPY  2008   Dr Stephenie Einstein    OB History   No obstetric history on file.      Home Medications    Prior to Admission medications   Medication Sig Start Date End Date Taking? Authorizing Provider  lisinopril (ZESTRIL) 2.5 MG tablet Take 2.5 mg by mouth daily. 08/22/22  Yes [provider]  nitrofurantoin, macrocrystal-monohydrate, (MACROBID) 100 MG capsule Take 1 capsule (100 mg total) by mouth 2 (two) times daily. 10/07/23  Yes Armella Stogner A, FNP  rosuvastatin (CRESTOR) 10 MG tablet Take 10 mg by mouth daily. 06/19/23  Yes [provider]  ALPRAZolam (XANAX) 0.25 MG tablet Take 0.25 mg by mouth at bedtime as needed for anxiety.    [provider]  CALCIUM PO Take 1,200 mg by mouth daily.    [provider]  Cholecalciferol (VITAMIN D-3) 1000 units CAPS Take 4,000 Units by mouth daily.    [provider]   Coenzyme Q10 100 MG TABS Take 100 mg by mouth daily. 01/14/20   [provider]  NON FORMULARY Juice Plus-Take 8 pills daily    [provider]  Omega-3 Fatty Acids (FISH OIL) 1000 MG CAPS Take 200 mg by mouth daily. 01/14/20   [provider]    Family History Family History  Problem Relation Age of Onset   Alzheimer's disease Mother    Colon polyps Mother    Heart Problems Father    Hypertension Father    Diabetes Father    Angina Father    Colon cancer Neg Hx    Esophageal cancer Neg Hx    Stomach cancer Neg Hx    Rectal cancer Neg Hx     Social History Social History   Tobacco Use   Smoking status: Former    Current packs/day: 0.00    Types: Cigarettes    Quit date: 06/14/2003    Years since quitting: 20.3   Smokeless tobacco: Never  Substance Use Topics   Alcohol use: Not Currently   Drug use: Not Currently     Allergies   Sulfa antibiotics   Review of Systems Review of Systems  Genitourinary:  Positive for hematuria.     Physical Exam Triage Vital Signs ED Triage Vitals  Encounter Vitals Group  BP 10/07/23 0855 (!) 149/79     Systolic BP Percentile --      Diastolic BP Percentile --      Pulse Rate 10/07/23 0855 81     Resp 10/07/23 0855 18     Temp 10/07/23 0855 97.9 F (36.6 C)     Temp Source 10/07/23 0855 Oral     SpO2 10/07/23 0855 98 %     Weight --      Height --      Head Circumference --      Peak Flow --      Pain Score 10/07/23 0854 0     Pain Loc --      Pain Education --      Exclude from Growth Chart --    No data found.  Updated Vital Signs BP (!) 149/79 (BP Location: Right Arm)   Pulse 81   Temp 97.9 F (36.6 C) (Oral)   Resp 18   LMP 07/29/2010   SpO2 98%   Visual Acuity Right Eye Distance:   Left Eye Distance:   Bilateral Distance:    Right Eye Near:   Left Eye Near:    Bilateral Near:     Physical Exam Vitals and nursing note reviewed.  Constitutional:      General: She is not  in acute distress.    Appearance: Normal appearance. She is not ill-appearing, toxic-appearing or diaphoretic.  Pulmonary:     Effort: Pulmonary effort is normal.  Neurological:     Mental Status: She is alert.  Psychiatric:        Mood and Affect: Mood normal.      UC Treatments / Results  Labs (all labs ordered are listed, but only abnormal results are displayed) Labs Reviewed  POCT URINALYSIS DIP (MANUAL ENTRY) - Abnormal; Notable for the following components:      Result Value   Color, UA orange (*)    Blood, UA large (*)    Protein Ur, POC =100 (*)    Nitrite, UA Positive (*)    Leukocytes, UA Small (1+) (*)    All other components within normal limits  URINE CULTURE    EKG   Radiology No results found.  Procedures Procedures (including critical care time)  Medications Ordered in UC Medications - No data to display  Initial Impression / Assessment and Plan / UC Course  I have reviewed the triage vital signs and the nursing notes.  Pertinent labs & imaging results that were available during my care of the patient were reviewed by me and considered in my medical decision making (see chart for details).     Hematuria-urine with small leuks, positive nitrites, large blood. She has not been taking Azo We will go ahead and treat for urinary tract infection at this time.  Treating with Macrobid.  Recommend push fluids Culture pending Follow-up for any continued issues Final Clinical Impressions(s) / UC Diagnoses   Final diagnoses:  Hematuria, unspecified type     Discharge Instructions      Treating you for urinary tract infection.  Drink plenty of water.    ED Prescriptions     Medication Sig Dispense Auth. Provider   nitrofurantoin, macrocrystal-monohydrate, (MACROBID) 100 MG capsule Take 1 capsule (100 mg total) by mouth 2 (two) times daily. 10 capsule Landa Pine, FNP      PDMP not reviewed this encounter.   Landa Pine, FNP 10/07/23  1219    Landa Pine,  FNP 10/07/23 1233

## 2023-10-07 NOTE — ED Triage Notes (Signed)
 Pt reports blood in her urine first noticed this morning, pt states it hurts when she pees. Pt did have a really bad yeast infection and was treated with medication.

## 2023-10-09 LAB — URINE CULTURE: Culture: >=100000 — AB

## 2023-10-12 DIAGNOSIS — F411 Generalized anxiety disorder: Secondary | ICD-10-CM | POA: Diagnosis not present

## 2023-10-12 DIAGNOSIS — Z6827 Body mass index (BMI) 27.0-27.9, adult: Secondary | ICD-10-CM | POA: Diagnosis not present

## 2023-10-12 DIAGNOSIS — R399 Unspecified symptoms and signs involving the genitourinary system: Secondary | ICD-10-CM | POA: Diagnosis not present

## 2023-11-09 DIAGNOSIS — N281 Cyst of kidney, acquired: Secondary | ICD-10-CM | POA: Diagnosis not present

## 2023-11-09 DIAGNOSIS — R3129 Other microscopic hematuria: Secondary | ICD-10-CM | POA: Diagnosis not present

## 2023-11-09 DIAGNOSIS — N3289 Other specified disorders of bladder: Secondary | ICD-10-CM | POA: Diagnosis not present

## 2023-11-13 ENCOUNTER — Ambulatory Visit (AMBULATORY_SURGERY_CENTER)

## 2023-11-13 ENCOUNTER — Encounter: Payer: Self-pay | Admitting: Gastroenterology

## 2023-11-13 VITALS — Ht 67.0 in | Wt 174.0 lb

## 2023-11-13 DIAGNOSIS — Z83719 Family history of colon polyps, unspecified: Secondary | ICD-10-CM

## 2023-11-13 MED ORDER — NA SULFATE-K SULFATE-MG SULF 17.5-3.13-1.6 GM/177ML PO SOLN: 1.0000 | Freq: Once | ORAL | 0 refills | Status: AC

## 2023-11-13 NOTE — Progress Notes (Signed)

## 2023-11-27 ENCOUNTER — Telehealth: Payer: Self-pay | Admitting: Gastroenterology

## 2023-11-27 NOTE — Telephone Encounter (Signed)
 PT is scheduled for a colonoscopy on 6/19 and was just diagnosed with a sinus infection. She is not on a regimen of antibiotics and wants to know can she proceed with procedure. Please advise.

## 2023-11-27 NOTE — Telephone Encounter (Signed)
 Pt stated that she was recently diagnosed with a sinus infection and taking antibiotics.   Pt states that she has no fever, no chest pain, no shortness of breath. Pt states that her only symptoms in her sinus cavity.   Pt was advised to proceed with the procedure as planned.

## 2023-11-30 ENCOUNTER — Encounter: Payer: Self-pay | Admitting: Gastroenterology

## 2023-11-30 ENCOUNTER — Ambulatory Visit: Admitting: Gastroenterology

## 2023-11-30 VITALS — BP 121/70 | HR 52 | Temp 97.8°F | Resp 14 | Ht 67.0 in | Wt 174.0 lb

## 2023-11-30 DIAGNOSIS — K5730 Diverticulosis of large intestine without perforation or abscess without bleeding: Secondary | ICD-10-CM

## 2023-11-30 DIAGNOSIS — Z1211 Encounter for screening for malignant neoplasm of colon: Secondary | ICD-10-CM

## 2023-11-30 DIAGNOSIS — D127 Benign neoplasm of rectosigmoid junction: Secondary | ICD-10-CM | POA: Diagnosis not present

## 2023-11-30 DIAGNOSIS — K640 First degree hemorrhoids: Secondary | ICD-10-CM

## 2023-11-30 DIAGNOSIS — Z83719 Family history of colon polyps, unspecified: Secondary | ICD-10-CM

## 2023-11-30 MED ORDER — SODIUM CHLORIDE 0.9 % IV SOLN: 500.0000 mL | Freq: Once | INTRAVENOUS | Status: DC

## 2023-11-30 NOTE — Progress Notes (Signed)
 Called to room to assist during endoscopic procedure.  Patient ID and intended procedure confirmed with present staff. Received instructions for my participation in the procedure from the performing physician.

## 2023-11-30 NOTE — Progress Notes (Signed)
 To pacu, VSS. Report to Rn.tb

## 2023-11-30 NOTE — Progress Notes (Signed)
 Pt's states no medical or surgical changes since previsit or office visit.

## 2023-11-30 NOTE — Patient Instructions (Signed)

## 2023-11-30 NOTE — Progress Notes (Signed)
 [facility] History and Physical   Primary Care Physician:  Olga Berthold, NP   Reason for Procedure:   FH polyps (mom)  Plan:    colon     HPI: Gabrielle Wilkerson is a 58 y.o. female    Past Medical History:  Diagnosis Date   Allergy    seasonal   Anxiety    IBS (irritable bowel syndrome)    Trochanteric bursitis of right hip 01/14/2020    Past Surgical History:  Procedure Laterality Date   COLONOSCOPY     was 58 years old   LAPAROSCOPY  age 81    of ovaries   SINOSCOPY  2008   Dr Stephenie Einstein    Prior to Admission medications   Medication Sig Start Date End Date Taking? Authorizing Provider  amoxicillin-clavulanate (AUGMENTIN) 875-125 MG tablet SMARTSIG:1 Tablet(s) By Mouth Every 12 Hours 11/27/23  Yes [provider]  Cholecalciferol (VITAMIN D-3) 1000 units CAPS Take 4,000 Units by mouth daily.   Yes [provider]  Coenzyme Q10 100 MG TABS Take 100 mg by mouth daily. 01/14/20  Yes [provider]  lisinopril (ZESTRIL) 2.5 MG tablet Take 2.5 mg by mouth daily. 08/22/22  Yes [provider]  NON FORMULARY Juice Plus-Take 10 pills daily   Yes [provider]  OVER THE COUNTER MEDICATION Take 1 capsule by mouth 3 (three) times daily. Slender X   Yes [provider]  rosuvastatin (CRESTOR) 10 MG tablet Take 10 mg by mouth daily. 06/19/23  Yes [provider]  ALPRAZolam (XANAX) 0.25 MG tablet Take 0.25 mg by mouth at bedtime as needed for anxiety.    [provider]  CALCIUM PO Take 1,200 mg by mouth daily.    [provider]    Current Outpatient Medications  Medication Sig Dispense Refill   amoxicillin-clavulanate (AUGMENTIN) 875-125 MG tablet SMARTSIG:1 Tablet(s) By Mouth Every 12 Hours     Cholecalciferol (VITAMIN D-3) 1000 units CAPS Take 4,000 Units by mouth daily.     Coenzyme Q10 100 MG TABS Take 100 mg by mouth daily.     lisinopril (ZESTRIL) 2.5 MG tablet Take 2.5 mg by  mouth daily.     NON FORMULARY Juice Plus-Take 10 pills daily     OVER THE COUNTER MEDICATION Take 1 capsule by mouth 3 (three) times daily. Slender X     rosuvastatin (CRESTOR) 10 MG tablet Take 10 mg by mouth daily.     ALPRAZolam (XANAX) 0.25 MG tablet Take 0.25 mg by mouth at bedtime as needed for anxiety.     CALCIUM PO Take 1,200 mg by mouth daily.     No current facility-administered medications for this visit.    Allergies as of 11/30/2023 - Review Complete 11/30/2023  Allergen Reaction Noted   Sulfa antibiotics Rash 04/28/2016    Family History  Problem Relation Age of Onset   Alzheimer's disease Mother    Colon polyps Mother    Heart Problems Father    Hypertension Father    Diabetes Father    Angina Father    Colon cancer Neg Hx    Esophageal cancer Neg Hx    Stomach cancer Neg Hx    Rectal cancer Neg Hx     Social History   Socioeconomic History   Marital status: Married    Spouse name: Not on file   Number of children: Not on file   Years of education: Not on file   Highest education level:  Not on file  Occupational History   Not on file  Tobacco Use   Smoking status: Former    Current packs/day: 0.00    Types: Cigarettes    Quit date: 06/14/2003    Years since quitting: 20.4   Smokeless tobacco: Never  Vaping Use   Vaping status: Never Used  Substance and Sexual Activity   Alcohol use: Not Currently   Drug use: Not Currently   Sexual activity: Yes  Other Topics Concern   Not on file  Social History Narrative   Not on file   Social Drivers of Health   Financial Resource Strain: Not on file  Food Insecurity: Not on file  Transportation Needs: Not on file  Physical Activity: Not on file  Stress: Not on file  Social Connections: Not on file  Intimate Partner Violence: Not on file    Review of Systems: Positive for none All other review of systems negative except as mentioned in the HPI.  Physical Exam: Vital signs in last 24  hours: @VSRANGES @   General:   Alert,  Well-developed, well-nourished, pleasant and cooperative in NAD Lungs:  Clear throughout to auscultation.   Heart:  Regular rate and rhythm; no murmurs, clicks, rubs,  or gallops. Abdomen:  Soft, nontender and nondistended. Normal bowel sounds.   Neuro/Psych:  Alert and cooperative. Normal mood and affect. A and O x 3    No significant changes were identified.  The patient continues to be an appropriate candidate for the planned procedure and anesthesia.   Magnus Schuller, MD. [facility] 11/30/2023 3:08 PM@

## 2023-11-30 NOTE — Op Note (Signed)
 [facility] Patient Name: Gabrielle Wilkerson Procedure Date: 11/30/2023 8:37 AM MRN: [id number] Endoscopist: Lajuan Pila , MD, [id number] Age: 58 Referring MD:  Date of Birth: 21-Jun-1965 Gender: Female Account #: [id number] Procedure:                Colonoscopy Indications:              Colon cancer screening in patient at increased                            risk: Family history of colon polyps (mom before                            age 6 years) Medicines:                Monitored Anesthesia Care Procedure:                Pre-Anesthesia Assessment:                           - Prior to the procedure, a History and Physical                            was performed, and patient medications and                            allergies were reviewed. The patient's tolerance of                            previous anesthesia was also reviewed. The risks                            and benefits of the procedure and the sedation                            options and risks were discussed with the patient.                            All questions were answered, and informed consent                            was obtained. Prior Anticoagulants: The patient has                            taken no anticoagulant or antiplatelet agents. ASA                            Grade Assessment: II - A patient with mild systemic                            disease. After reviewing the risks and benefits,                            the patient was deemed in satisfactory condition to  undergo the procedure.                           After obtaining informed consent, the colonoscope                            was passed under direct vision. Throughout the                            procedure, the patient's blood pressure, pulse, and                            oxygen saturations were monitored continuously. The                            Olympus Scope SN [id number] was introduced  through the                            anus and advanced to the 2 cm into the ileum. The                            colonoscopy was performed without difficulty. The                            patient tolerated the procedure well. The quality                            of the bowel preparation was good. The terminal                            ileum, ileocecal valve, appendiceal orifice, and                            rectum were photographed. Scope In: 8:51:01 AM Scope Out: 9:07:50 AM Scope Withdrawal Time: 0 hours 12 minutes 13 seconds  Total Procedure Duration: 0 hours 16 minutes 49 seconds  Findings:                 A 4 mm polyp was found in the recto-sigmoid colon.                            The polyp was sessile. The polyp was removed with a                            cold snare. Resection and retrieval were complete.                           A few small-mouthed diverticula were found in the                            sigmoid colon.                           Non-bleeding internal hemorrhoids were found during  retroflexion. The hemorrhoids were small and Grade                            I (internal hemorrhoids that do not prolapse).                           The terminal ileum appeared normal.                           The exam was otherwise without abnormality on                            direct and retroflexion views. Complications:            No immediate complications. Estimated Blood Loss:     Estimated blood loss: none. Impression:               - One 4 mm polyp at the recto-sigmoid colon,                            removed with a cold snare. Resected and retrieved.                           - Minimal sigmoid diverticulosis.                           - Non-bleeding internal hemorrhoids.                           - The examined portion of the ileum was normal.                           - The examination was otherwise normal on direct                             and retroflexion views. Recommendation:           - Patient has a contact number available for                            emergencies. The signs and symptoms of potential                            delayed complications were discussed with the                            patient. Return to normal activities tomorrow.                            Written discharge instructions were provided to the                            patient.                           - Resume previous diet.                           -  Continue present medications.                           - Await pathology results.                           - Repeat colonoscopy for surveillance based on                            pathology results.                           - The findings and recommendations were discussed                            with the patient's family. Lajuan Pila, MD 11/30/2023 9:12:10 AM This report has been signed electronically.

## 2023-12-01 ENCOUNTER — Telehealth: Payer: Self-pay | Admitting: *Deleted

## 2023-12-01 NOTE — Telephone Encounter (Signed)
  Follow up Call-     11/30/2023    7:43 AM  Call back number  Post procedure Call Back phone  # [phone]  Permission to leave phone message Yes     Patient questions:  Do you have a fever, pain , or abdominal swelling? No. Pain Score  0 *  Have you tolerated food without any problems? Yes.    Have you been able to return to your normal activities? Yes.    Do you have any questions about your discharge instructions: Diet   No. Medications  No. Follow up visit  No.  Do you have questions or concerns about your Care? No.  Actions: * If pain score is 4 or above: No action needed, pain <4.

## 2023-12-04 LAB — SURGICAL PATHOLOGY

## 2023-12-14 ENCOUNTER — Ambulatory Visit: Payer: Self-pay | Admitting: Gastroenterology

## 2023-12-22 ENCOUNTER — Telehealth: Payer: Self-pay | Admitting: Gastroenterology

## 2023-12-22 NOTE — Telephone Encounter (Signed)
 Patient called and stated that she was wanting to know if she would have wait 2 more year to have her colonoscopy and remove those pre cancerous polyps, could those polyps turned cancerous or is it a type of slow growing type. Patient is requesting a call back. Please advise.

## 2023-12-22 NOTE — Telephone Encounter (Signed)
 Returned patient call & she would like the timeline of her next colonoscopy moved up to at least 5 years instead of 7 d/t biopsy results and having a family history of polyps. Concerned if she waited another two years the polyp removed this time could have become cancerous. She is aware that doctor's have a set of guidelines that they follow & insurance is a factor as well, however she stated doctor's can over ride the guideline and is requesting review.

## 2023-12-23 NOTE — Telephone Encounter (Signed)
 Lets put recall colonoscopy for 5 years instead of 7 Please let her know RG

## 2023-12-26 NOTE — Telephone Encounter (Signed)
 Called and informed patient that recall will now be 5 years. Patient was advised to call us  in the interim if she has any concerns. Patient verbalized understanding and had no concerns at the end of the call.   Colonoscopy recall updated in epic.

## 2024-01-17 DIAGNOSIS — J309 Allergic rhinitis, unspecified: Secondary | ICD-10-CM | POA: Diagnosis not present

## 2024-01-17 DIAGNOSIS — J320 Chronic maxillary sinusitis: Secondary | ICD-10-CM | POA: Diagnosis not present

## 2024-01-17 DIAGNOSIS — J343 Hypertrophy of nasal turbinates: Secondary | ICD-10-CM | POA: Diagnosis not present

## 2024-01-29 DIAGNOSIS — I10 Essential (primary) hypertension: Secondary | ICD-10-CM | POA: Diagnosis not present

## 2024-01-29 DIAGNOSIS — E663 Overweight: Secondary | ICD-10-CM | POA: Diagnosis not present

## 2024-01-29 DIAGNOSIS — E559 Vitamin D deficiency, unspecified: Secondary | ICD-10-CM | POA: Diagnosis not present

## 2024-01-29 DIAGNOSIS — E785 Hyperlipidemia, unspecified: Secondary | ICD-10-CM | POA: Diagnosis not present

## 2024-01-31 DIAGNOSIS — L820 Inflamed seborrheic keratosis: Secondary | ICD-10-CM | POA: Diagnosis not present

## 2024-02-05 DIAGNOSIS — J3489 Other specified disorders of nose and nasal sinuses: Secondary | ICD-10-CM | POA: Diagnosis not present

## 2024-02-05 DIAGNOSIS — J324 Chronic pansinusitis: Secondary | ICD-10-CM | POA: Diagnosis not present

## 2024-02-05 DIAGNOSIS — J320 Chronic maxillary sinusitis: Secondary | ICD-10-CM | POA: Diagnosis not present

## 2024-02-05 DIAGNOSIS — J309 Allergic rhinitis, unspecified: Secondary | ICD-10-CM | POA: Diagnosis not present

## 2024-02-23 ENCOUNTER — Ambulatory Visit (INDEPENDENT_AMBULATORY_CARE_PROVIDER_SITE_OTHER): Admitting: Internal Medicine

## 2024-02-23 ENCOUNTER — Encounter: Payer: Self-pay | Admitting: Internal Medicine

## 2024-02-23 VITALS — BP 130/78 | HR 76 | Resp 18 | Ht 67.0 in | Wt 172.8 lb

## 2024-02-23 DIAGNOSIS — J343 Hypertrophy of nasal turbinates: Secondary | ICD-10-CM | POA: Diagnosis not present

## 2024-02-23 DIAGNOSIS — L500 Allergic urticaria: Secondary | ICD-10-CM | POA: Diagnosis not present

## 2024-02-23 DIAGNOSIS — L301 Dyshidrosis [pompholyx]: Secondary | ICD-10-CM

## 2024-02-23 DIAGNOSIS — J3089 Other allergic rhinitis: Secondary | ICD-10-CM | POA: Diagnosis not present

## 2024-02-23 DIAGNOSIS — T63481D Toxic effect of venom of other arthropod, accidental (unintentional), subsequent encounter: Secondary | ICD-10-CM

## 2024-02-23 DIAGNOSIS — T63481A Toxic effect of venom of other arthropod, accidental (unintentional), initial encounter: Secondary | ICD-10-CM

## 2024-02-23 NOTE — Progress Notes (Signed)
 NEW PATIENT Date of Service/Encounter:  02/23/24 Referring provider: Llewellyn Gerard LABOR, DO Primary care provider: Erick Greig LABOR, NP  Subjective:  Reiley Bertagnolli is a 58 y.o. female  presenting today for evaluation of chronic sinusitis History obtained from: chart review and patient.   Discussed the use of AI scribe software for clinical note transcription with the patient, who gave verbal consent to proceed.  History of Present Illness Novie Maggio Mckenzye Cutright is a 58 year old female who presents with chronic sinusitis and potential allergies prior to scheduled sinus surgery.  Chronic sinonasal symptoms - Persistent sinus symptoms since COVID-19 infection in 2020-2021, including headaches, congestion, sinus tenderness, and drainage - Sensation of swollen sinuses - Alternating nasal congestion when lying down - Fluctuating sense of smell - Frequent morning headaches that improve as the day progresses - Recurrent sinus infections despite two rounds of antibiotics, twice daily use of a mini pot, sinus spray, and Flonase - Symptoms persist despite current treatments  Cutaneous hypersensitivity reactions - Allergic reaction with hives approximately 1.5 years ago after eating a restaurant dish, resolved with Benadryl - Suspected shrimp as the trigger; has avoided shrimp since the reaction - No recurrence of hives or reactions to other foods, including mushrooms  Eczematous dermatitis - Eczema on hands for the past 1.5 to 2 years - Current symptoms improved compared to prior flares - Uses moisturizers such as Aquaphor and Eucerin - Trial of clobetasol without significant improvement - Avoids excessive hand washing and hand sanitizer  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 - Subsequent yellow jacket stings resulted in swelling but did not require hospitalization  Allergic rhinoconjunctivitis - History of seasonal allergy  symptoms, typically in spring and fall - Decrease in frequency of seasonal allergy episodes over the past few years - Uses Flonase for allergy symptoms but prefers to minimize medication use - Exposure to cats and a dog at home without significant allergic reactions      Other allergy screening: Asthma: no Rhino conjunctivitis: yes Food allergy: yes Medication allergy: yes Hymenoptera allergy: LLR with wasps  Urticaria: with unknown food exposure  Eczema:no History of recurrent infections suggestive of immunodeficency: no Vaccinations are up to date.   Past Medical History: Past Medical History:  Diagnosis Date   Allergy    seasonal   Anxiety    IBS (irritable bowel syndrome)    Trochanteric bursitis of right hip 01/14/2020   Medication List:  Current Outpatient Medications  Medication Sig Dispense Refill   ALPRAZolam (XANAX) 0.25 MG tablet Take 0.25 mg by mouth at bedtime as needed for anxiety.     CALCIUM PO Take 1,200 mg by mouth daily.     Cholecalciferol (VITAMIN D-3) 1000 units CAPS Take 4,000 Units by mouth daily.     Coenzyme Q10 100 MG TABS Take 100 mg by mouth daily.     amoxicillin-clavulanate (AUGMENTIN) 875-125 MG tablet SMARTSIG:1 Tablet(s) By Mouth Every 12 Hours     NON FORMULARY Juice Plus-Take 10 pills daily     OVER THE COUNTER MEDICATION Take 1 capsule by mouth 3 (three) times daily. Slender X     rosuvastatin (CRESTOR) 10 MG tablet Take 10 mg by mouth daily.     No current facility-administered medications for this visit.   Known Allergies:  Allergies  Allergen Reactions   Sulfa Antibiotics Rash   Past Surgical History: Past Surgical History:  Procedure Laterality Date   COLONOSCOPY  was 58 years old   LAPAROSCOPY  age 69    of ovaries   SINOSCOPY  2008   Dr Devora   Family History: Family History  Problem Relation Age of Onset   Alzheimer's disease Mother    Colon polyps Mother    Heart Problems Father    Hypertension Father     Diabetes Father    Angina Father    Colon cancer Neg Hx    Esophageal cancer Neg Hx    Stomach cancer Neg Hx    Rectal cancer Neg Hx    Social History: Aralyn lives single-family home built in [place].  No water damage in the house.  Wood in the family room carpet in the bedroom.  Dogs access to bedroom.  No roaches advised to be off floor.  No dust mite precaution.  Works in [employer].  Previous smoker from [phone] [id number].  Smokes 1 to 2 packs/day..   ROS:  All other systems negative except as noted per HPI.  Objective:  Blood pressure 130/78, pulse 76, resp. rate 18, height 5' 7 (1.702 m), weight 172 lb 12.8 oz (78.4 kg), last menstrual period 07/29/2010, SpO2 99%. Body mass index is 27.06 kg/m. Physical Exam:  General Appearance:  Alert, cooperative, no distress, appears stated age  Head:  Normocephalic, without obvious abnormality, atraumatic  Eyes:  Conjunctiva clear, EOM's intact  Ears EACs normal bilaterally and normal TMs bilaterally  Nose: Nares normal, edematous nasal mucosa, hypertrophic turbinates, no visible anterior polyps, and septum midline  Throat: Lips, tongue normal; teeth and gums normal, + cobblestoning  Neck: Supple, symmetrical  Lungs:   clear to auscultation bilaterally, Respirations unlabored, no coughing  Heart:  regular rate and rhythm and no murmur, Appears well perfused  Extremities: No edema  Skin: Skin color, texture, turgor normal and no rashes or lesions on visualized portions of skin  Neurologic: No gross deficits   Diagnostics: None done    Labs:  Lab Orders  No laboratory test(s) ordered today     Assessment and Plan  Assessment and Plan Assessment & Plan Chronic sinusitis and allergic rhinitis Chronic sinusitis with allergic rhinitis, likely worsened post-COVID. Symptoms suggest possible anatomical obstruction. Differential includes allergic inflammation versus anatomical issues. - Perform skin testing for  environmental allergens. - Proceed with planned sinus surgery. - Consider allergy shots post-surgery if symptoms persist. 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 No recurrence of hives. Shellfish more likely allergen than spices. Skin-only symptoms less concerning than multi-system reactions. Benadryl effective, indicating less severe reaction. - Perform skin testing for shellfish allergy. - Avoid shellfish until testing is complete.   Hand eczema Chronic hand eczema, likely dyshidrotic, exacerbated by wet work, possibly worsened post-COVID. Topical management emphasized due to potential side effects of systemic treatments. - Use clobetasol with Vaseline under gloves overnight. - Avoid getting hands wet and use moisturizers regularly. - Avoid hand sanitizers.   Wasp Large Local reactions  - Reaction to wasps is consistent with large local reactions  - this is a mild allergy and risk of serious allergic reactions very low  - For future reactions recommend: zyrtec 10mg  1-2 times daily until swelling resolves   Follow up: for allergy testing (1-55 and shellfish)   - avoid all antihistamines for 3 days prior     This note in its entirety was forwarded to the Provider who  requested this consultation.  Other:    Thank you for your kind referral. I appreciate the opportunity to take part in Laketa's care. Please do not hesitate to contact me with questions.  Sincerely,  Thank you so much for letting me partake in your care today.  Don't hesitate to reach out if you have any additional concerns!  Hargis Springer, MD  [facility] [place] [place]

## 2024-02-23 NOTE — Patient Instructions (Signed)
 Chronic sinusitis and allergic rhinitis Chronic sinusitis with allergic rhinitis, likely worsened post-COVID. Symptoms suggest possible anatomical obstruction. Differential includes allergic inflammation versus anatomical issues. - Perform skin testing for environmental allergens. - Proceed with planned sinus surgery. - Consider allergy shots post-surgery if symptoms persist. 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 No recurrence of hives. Shellfish more likely allergen than spices. Skin-only symptoms less concerning than multi-system reactions. Benadryl effective, indicating less severe reaction. - Perform skin testing for shellfish allergy. - Avoid shellfish until testing is complete.   Hand eczema Chronic hand eczema, likely dyshidrotic, exacerbated by wet work, possibly worsened post-COVID. Topical management emphasized due to potential side effects of systemic treatments. - Use clobetasol with Vaseline under gloves overnight. - Avoid getting hands wet and use moisturizers regularly. - Avoid hand sanitizers.   Wasp Large Local reactions  - Reaction to wasps is consistent with large local reactions  - this is a mild allergy and risk of serious allergic reactions very low  - For future reactions recommend: zyrtec 10mg  1-2 times daily until swelling resolves   Follow up: for allergy testing (1-55 and shellfish)   - avoid all antihistamines for 3 days prior

## 2024-03-05 ENCOUNTER — Other Ambulatory Visit: Payer: Self-pay | Admitting: Otolaryngology

## 2024-03-05 DIAGNOSIS — J324 Chronic pansinusitis: Secondary | ICD-10-CM | POA: Diagnosis not present

## 2024-03-05 DIAGNOSIS — J3489 Other specified disorders of nose and nasal sinuses: Secondary | ICD-10-CM | POA: Diagnosis not present

## 2024-03-05 DIAGNOSIS — J338 Other polyp of sinus: Secondary | ICD-10-CM | POA: Diagnosis not present

## 2024-03-06 LAB — SURGICAL PATHOLOGY

## 2024-03-13 DIAGNOSIS — J309 Allergic rhinitis, unspecified: Secondary | ICD-10-CM | POA: Diagnosis not present

## 2024-03-13 DIAGNOSIS — J324 Chronic pansinusitis: Secondary | ICD-10-CM | POA: Diagnosis not present

## 2024-03-20 DIAGNOSIS — J324 Chronic pansinusitis: Secondary | ICD-10-CM | POA: Diagnosis not present

## 2024-03-25 NOTE — Patient Instructions (Incomplete)
 Chronic sinusitis with nasal polyp Past history: Chronic sinusitis , likely worsened post-COVID. Symptoms suggest possible anatomical obstruction. Differential includes allergic inflammation versus anatomical issues. - Skin testing today is negative with adequate controls.  Intradermal skin testing is negative  - Copy of skin test given - Information given on Dupixent for nasal polyps. Can discuss with Dr. Llewellyn. Let us  know if interested in starting Dupixent - Consider allergy shots post-surgery if symptoms persist. 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 No recurrence of hives. Shellfish more likely allergen than spices. Skin-only symptoms less concerning than multi-system reactions. Benadryl effective, indicating less severe reaction. - Skin testing today is negative to shellfish. We will get lab work to complement your skin testing. If negative we will offer an in office oral food challenge to shellfish - Avoid shellfish    Hand eczema Chronic hand eczema, likely dyshidrotic, exacerbated by wet work, possibly worsened post-COVID. Topical management emphasized due to potential side effects of systemic treatments. - Use clobetasol with Vaseline under gloves overnight. - Avoid getting hands wet and use moisturizers regularly. - Avoid hand sanitizers.   Wasp Large Local reactions  - Reaction to wasps is consistent with large local reactions  - this is a mild allergy and risk of serious allergic reactions very low  - For future reactions recommend: zyrtec 10mg  1-2 times daily until swelling resolves   Follow up: 2-3 months or sooner if needed

## 2024-03-26 ENCOUNTER — Ambulatory Visit (INDEPENDENT_AMBULATORY_CARE_PROVIDER_SITE_OTHER): Admitting: Family

## 2024-03-26 ENCOUNTER — Encounter: Payer: Self-pay | Admitting: Family

## 2024-03-26 DIAGNOSIS — J330 Polyp of nasal cavity: Secondary | ICD-10-CM

## 2024-03-26 DIAGNOSIS — J310 Chronic rhinitis: Secondary | ICD-10-CM | POA: Diagnosis not present

## 2024-03-26 DIAGNOSIS — L500 Allergic urticaria: Secondary | ICD-10-CM

## 2024-03-26 NOTE — Progress Notes (Signed)
 Date of Service/Encounter:  03/26/24  Allergy testing appointment   Initial visit on 02/23/2024, seen for chronic sinusitis with allergic rhinitis, food induced urticaria, hand eczema and wasp large local reaction.  Please see that note for additional details.  She reports that she had sinus surgery on March 05, 2024.  At the time she was given a broad-spectrum antibiotic.  Last week she went for follow-up and is now on another antibiotic (ciprofloxacin) for 14 days because when they cultured her it grew something (serratia marcescens)  Today reports for allergy diagnostic testing:    DIAGNOSTICS:  Skin Testing: Environmental allergy panel and select foods. Adequate positive and negative controls Results discussed with patient/family.  Airborne Adult Perc - 03/26/24 1429     Time Antigen Placed 1429    Allergen Manufacturer Jestine    Location Back    Number of Test 55    Panel 1 Select    1. Control-Buffer 50% Glycerol Negative    2. Control-Histamine 3+    3. Bahia Negative    4. French Southern Territories Negative    5. Johnson Negative    6. [place]  Blue Negative    7. Meadow Fescue Negative    8. Perennial Rye Negative    9. Timothy Negative    10. Ragweed Mix Negative    11. Cocklebur Negative    12. Plantain,  English Negative    13. Baccharis Negative    14. Dog Fennel Negative    15. Russian Thistle Negative    16. Lamb's Quarters Negative    17. Sheep Sorrell Negative    18. Rough Pigweed Negative    19. Marsh Elder, Rough Negative    20. Mugwort, Common Negative    21. Box, Elder Negative    22. Cedar, red Negative    23. Sweet Gum Negative    24. Pecan Pollen Negative    25. Pine Mix Negative    26. Walnut, Black Pollen Negative    27. Red Mulberry Negative    28. Ash Mix Negative    29. Birch Mix Negative    30. Beech American Negative    31. Cottonwood, [place] Negative    32. Hickory, White Negative    33. Maple Mix Negative    34. Oak, [place] Mix Negative     35. Sycamore Eastern Negative    36. Alternaria Alternata Negative    37. Cladosporium Herbarum Negative    38. Aspergillus Mix Negative    39. Penicillium Mix Negative    40. Bipolaris Sorokiniana (Helminthosporium) Negative    41. Drechslera Spicifera (Curvularia) Negative    42. Mucor Plumbeus Negative    43. Fusarium Moniliforme Negative    44. Aureobasidium Pullulans (pullulara) Negative    45. Rhizopus Oryzae Negative    46. Botrytis Cinera Negative    47. Epicoccum Nigrum Negative    48. Phoma Betae Negative    49. Dust Mite Mix Negative    50. Cat Hair 10,000 BAU/ml Negative    51.  Dog Epithelia Negative    52. Mixed Feathers Negative    53. Horse Epithelia Negative    54. Cockroach, German Negative    55. Tobacco Leaf Negative          Intradermal - 03/26/24 1540     Time Antigen Placed 1540    Allergen Manufacturer Greer    Location Arm    Number of Test 16    Intradermal Select    Control Negative  Bahia Negative    French Southern Territories Negative    Johnson Negative    7 Grass Negative    Ragweed Mix Negative    Weed Mix Negative    Tree Mix Negative    Mold 1 Negative    Mold 2 Negative    Mold 3 Negative    Mold 4 Negative    Mite Mix Negative    Cat Negative    Dog Negative    Cockroach Negative          Food Adult Perc - 03/26/24 1400     Time Antigen Placed 1429    Allergen Manufacturer Jestine    Location Back    Number of allergen test 6    8. Shellfish Mix Negative    23. Shrimp Negative    24. Crab Negative    25. Lobster Negative    26. Oyster Negative    27. Scallops Negative          Allergy testing results were read and interpreted by myself, documented by clinical staff.  Patient provided with copy of allergy testing along with avoidance measures when indicated.   Chronic sinusitis with nasal polyp Past history: Chronic sinusitis , likely worsened post-COVID. Symptoms suggest possible anatomical obstruction. Differential  includes allergic inflammation versus anatomical issues. - Skin testing today is negative with adequate controls.  Intradermal skin testing is negative  - Copy of skin test given - Information given on Dupixent for nasal polyps. Can discuss with Dr. Llewellyn. Let us  know if interested in starting Dupixent - Consider allergy shots post-surgery if symptoms persist. 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 No recurrence of hives. Shellfish more likely allergen than spices. Skin-only symptoms less concerning than multi-system reactions. Benadryl effective, indicating less severe reaction. - Skin testing today is negative to shellfish. We will get lab work to complement your skin testing. If negative we will offer an in office oral food challenge to shellfish - Avoid shellfish    Hand eczema Chronic hand eczema, likely dyshidrotic, exacerbated by wet work, possibly worsened post-COVID. Topical management emphasized due to potential side effects of systemic treatments. - Use clobetasol with Vaseline under gloves overnight. - Avoid getting hands wet and use moisturizers regularly. - Avoid hand sanitizers.   Wasp Large Local reactions  - Reaction to wasps is consistent with large local reactions  - this is a mild allergy and risk of serious allergic reactions very low  - For future reactions recommend: zyrtec 10mg  1-2 times daily until swelling resolves   Wanda Craze, FNP [facility] 

## 2024-04-09 ENCOUNTER — Other Ambulatory Visit: Payer: Self-pay | Admitting: Family

## 2024-04-09 ENCOUNTER — Telehealth: Payer: Self-pay | Admitting: Family

## 2024-04-09 DIAGNOSIS — J310 Chronic rhinitis: Secondary | ICD-10-CM

## 2024-04-09 NOTE — Telephone Encounter (Signed)
 Gabrielle Wilkerson called and wanted to speak with you regarding her allergy testing. She states she is still unsure if she is actually allergic to anything and if so, what. She would like for you to call her at [phone].

## 2024-04-09 NOTE — Progress Notes (Signed)
 See telephone encounter from today.  Wanda Craze, FNP [facility]

## 2024-04-09 NOTE — Telephone Encounter (Signed)
 Called and spoke with Tully and she reports one of the intradermals in particular became itchy over the weekend and wonders if I remembered which one I asked her if it was itchy. Skin testing on 03/26/24  was negative. Offered to get lab work to environmental allergies to confirm skin testing. She would like to email or my chart the lab order. Norman are you able to add the lab order to a my chart message?

## 2024-04-15 DIAGNOSIS — R0982 Postnasal drip: Secondary | ICD-10-CM | POA: Diagnosis not present

## 2024-04-15 DIAGNOSIS — Z9889 Other specified postprocedural states: Secondary | ICD-10-CM | POA: Diagnosis not present

## 2024-04-15 DIAGNOSIS — J324 Chronic pansinusitis: Secondary | ICD-10-CM | POA: Diagnosis not present

## 2024-04-19 DIAGNOSIS — R399 Unspecified symptoms and signs involving the genitourinary system: Secondary | ICD-10-CM | POA: Diagnosis not present

## 2024-05-03 DIAGNOSIS — I10 Essential (primary) hypertension: Secondary | ICD-10-CM | POA: Diagnosis not present

## 2024-05-03 DIAGNOSIS — F411 Generalized anxiety disorder: Secondary | ICD-10-CM | POA: Diagnosis not present

## 2024-09-06 ENCOUNTER — Ambulatory Visit: Admitting: Cardiology
